# Patient Record
Sex: Female | Born: 1950 | Race: Black or African American | Hispanic: No | Smoking: Never smoker
Health system: Southern US, Community
[De-identification: ages and names within clinical notes are randomized; demographics above are authoritative.]

## PROBLEM LIST (undated history)

## (undated) DIAGNOSIS — C73 Malignant neoplasm of thyroid gland: Secondary | ICD-10-CM

## (undated) DIAGNOSIS — E785 Hyperlipidemia, unspecified: Secondary | ICD-10-CM

## (undated) DIAGNOSIS — I10 Essential (primary) hypertension: Secondary | ICD-10-CM

## (undated) DIAGNOSIS — D219 Benign neoplasm of connective and other soft tissue, unspecified: Secondary | ICD-10-CM

## (undated) DIAGNOSIS — H409 Unspecified glaucoma: Secondary | ICD-10-CM

## (undated) HISTORY — PX: CATARACT EXTRACTION: SUR2

## (undated) HISTORY — PX: THYROIDECTOMY: SHX17

## (undated) HISTORY — DX: Hyperlipidemia, unspecified: E78.5

## (undated) HISTORY — DX: Unspecified glaucoma: H40.9

## (undated) HISTORY — PX: PARATHYROIDECTOMY: SHX19

## (undated) HISTORY — PX: OTHER SURGICAL HISTORY: SHX169

## (undated) HISTORY — PX: ABDOMINAL HYSTERECTOMY: SHX81

---

## 1997-09-20 ENCOUNTER — Ambulatory Visit (HOSPITAL_COMMUNITY): Admission: RE | Admit: 1997-09-20 | Discharge: 1997-09-20 | Payer: Self-pay | Admitting: Cardiology

## 1998-06-14 ENCOUNTER — Ambulatory Visit (HOSPITAL_COMMUNITY): Admission: RE | Admit: 1998-06-14 | Discharge: 1998-06-14 | Payer: Self-pay | Admitting: Cardiology

## 1998-06-14 ENCOUNTER — Encounter: Payer: Self-pay | Admitting: Cardiology

## 1998-09-21 ENCOUNTER — Encounter (INDEPENDENT_AMBULATORY_CARE_PROVIDER_SITE_OTHER): Payer: Self-pay | Admitting: Specialist

## 1998-09-21 ENCOUNTER — Ambulatory Visit (HOSPITAL_COMMUNITY): Admission: RE | Admit: 1998-09-21 | Discharge: 1998-09-21 | Payer: Self-pay

## 1998-10-03 ENCOUNTER — Inpatient Hospital Stay (HOSPITAL_COMMUNITY): Admission: RE | Admit: 1998-10-03 | Discharge: 1998-10-06 | Payer: Self-pay

## 1998-12-03 ENCOUNTER — Ambulatory Visit (HOSPITAL_COMMUNITY): Admission: RE | Admit: 1998-12-03 | Discharge: 1998-12-03 | Payer: Self-pay

## 1998-12-13 ENCOUNTER — Ambulatory Visit (HOSPITAL_COMMUNITY): Admission: RE | Admit: 1998-12-13 | Discharge: 1998-12-13 | Payer: Self-pay

## 1999-03-15 ENCOUNTER — Encounter: Payer: Self-pay | Admitting: Cardiology

## 1999-03-15 ENCOUNTER — Ambulatory Visit (HOSPITAL_COMMUNITY): Admission: RE | Admit: 1999-03-15 | Discharge: 1999-03-15 | Payer: Self-pay | Admitting: Cardiology

## 1999-11-11 ENCOUNTER — Ambulatory Visit (HOSPITAL_COMMUNITY): Admission: RE | Admit: 1999-11-11 | Discharge: 1999-11-11 | Payer: Self-pay | Admitting: *Deleted

## 1999-11-15 ENCOUNTER — Ambulatory Visit (HOSPITAL_COMMUNITY): Admission: RE | Admit: 1999-11-15 | Discharge: 1999-11-15 | Payer: Self-pay | Admitting: *Deleted

## 1999-12-30 ENCOUNTER — Emergency Department (HOSPITAL_COMMUNITY): Admission: EM | Admit: 1999-12-30 | Discharge: 1999-12-31 | Payer: Self-pay | Admitting: *Deleted

## 1999-12-30 ENCOUNTER — Encounter: Payer: Self-pay | Admitting: *Deleted

## 2000-01-10 ENCOUNTER — Encounter: Admission: RE | Admit: 2000-01-10 | Discharge: 2000-01-10 | Payer: Self-pay | Admitting: Internal Medicine

## 2000-01-10 ENCOUNTER — Encounter: Payer: Self-pay | Admitting: Internal Medicine

## 2000-04-24 ENCOUNTER — Ambulatory Visit (HOSPITAL_COMMUNITY): Admission: RE | Admit: 2000-04-24 | Discharge: 2000-04-24 | Payer: Self-pay | Admitting: Cardiology

## 2000-04-24 ENCOUNTER — Encounter: Payer: Self-pay | Admitting: Cardiology

## 2001-05-10 ENCOUNTER — Ambulatory Visit (HOSPITAL_COMMUNITY): Admission: RE | Admit: 2001-05-10 | Discharge: 2001-05-10 | Payer: Self-pay | Admitting: Endocrinology

## 2001-05-10 ENCOUNTER — Encounter: Payer: Self-pay | Admitting: Endocrinology

## 2001-05-14 ENCOUNTER — Ambulatory Visit (HOSPITAL_COMMUNITY): Admission: RE | Admit: 2001-05-14 | Discharge: 2001-05-14 | Payer: Self-pay | Admitting: Endocrinology

## 2002-05-17 ENCOUNTER — Encounter (INDEPENDENT_AMBULATORY_CARE_PROVIDER_SITE_OTHER): Payer: Self-pay | Admitting: Specialist

## 2002-05-17 ENCOUNTER — Inpatient Hospital Stay (HOSPITAL_COMMUNITY): Admission: RE | Admit: 2002-05-17 | Discharge: 2002-05-19 | Payer: Self-pay | Admitting: Obstetrics and Gynecology

## 2002-10-05 ENCOUNTER — Emergency Department (HOSPITAL_COMMUNITY): Admission: EM | Admit: 2002-10-05 | Discharge: 2002-10-05 | Payer: Self-pay | Admitting: Emergency Medicine

## 2002-10-09 ENCOUNTER — Encounter: Payer: Self-pay | Admitting: Emergency Medicine

## 2002-10-09 ENCOUNTER — Emergency Department (HOSPITAL_COMMUNITY): Admission: EM | Admit: 2002-10-09 | Discharge: 2002-10-09 | Payer: Self-pay | Admitting: Emergency Medicine

## 2002-11-07 ENCOUNTER — Encounter: Payer: Self-pay | Admitting: Cardiology

## 2002-11-07 ENCOUNTER — Ambulatory Visit (HOSPITAL_COMMUNITY): Admission: RE | Admit: 2002-11-07 | Discharge: 2002-11-07 | Payer: Self-pay | Admitting: Cardiology

## 2003-08-18 ENCOUNTER — Encounter (HOSPITAL_COMMUNITY): Admission: RE | Admit: 2003-08-18 | Discharge: 2003-11-16 | Payer: Self-pay | Admitting: Cardiology

## 2004-01-26 ENCOUNTER — Ambulatory Visit (HOSPITAL_COMMUNITY): Admission: RE | Admit: 2004-01-26 | Discharge: 2004-01-26 | Payer: Self-pay | Admitting: Cardiology

## 2004-10-02 ENCOUNTER — Ambulatory Visit (HOSPITAL_COMMUNITY): Admission: RE | Admit: 2004-10-02 | Discharge: 2004-10-02 | Payer: Self-pay | Admitting: Gastroenterology

## 2004-11-04 ENCOUNTER — Other Ambulatory Visit: Admission: RE | Admit: 2004-11-04 | Discharge: 2004-11-04 | Payer: Self-pay | Admitting: Obstetrics and Gynecology

## 2005-12-08 ENCOUNTER — Encounter (HOSPITAL_COMMUNITY): Admission: RE | Admit: 2005-12-08 | Discharge: 2006-03-08 | Payer: Self-pay | Admitting: Cardiology

## 2007-09-25 ENCOUNTER — Emergency Department (HOSPITAL_COMMUNITY): Admission: EM | Admit: 2007-09-25 | Discharge: 2007-09-25 | Payer: Self-pay | Admitting: Family Medicine

## 2008-10-27 ENCOUNTER — Encounter: Admission: RE | Admit: 2008-10-27 | Discharge: 2008-10-27 | Payer: Self-pay | Admitting: Cardiology

## 2010-03-16 ENCOUNTER — Encounter: Payer: Self-pay | Admitting: Internal Medicine

## 2010-03-17 ENCOUNTER — Encounter: Payer: Self-pay | Admitting: Internal Medicine

## 2010-03-17 ENCOUNTER — Encounter: Payer: Self-pay | Admitting: Family Medicine

## 2010-04-19 ENCOUNTER — Other Ambulatory Visit (HOSPITAL_COMMUNITY): Payer: Self-pay | Admitting: Cardiology

## 2010-05-10 ENCOUNTER — Other Ambulatory Visit (HOSPITAL_COMMUNITY): Payer: Self-pay

## 2010-05-16 ENCOUNTER — Other Ambulatory Visit (HOSPITAL_COMMUNITY): Payer: Self-pay | Admitting: Cardiology

## 2010-06-12 ENCOUNTER — Other Ambulatory Visit (HOSPITAL_COMMUNITY): Payer: Self-pay

## 2010-07-12 NOTE — Op Note (Signed)
NAME:  Cynthia Pearson, Cynthia Pearson                       ACCOUNT NO.:  [id number]   MEDICAL RECORD NO.:  [id number]                   PATIENT TYPE:  INP   LOCATION:  9198                                 FACILITY:  [facility]   PHYSICIAN:  Carrington Clamp, M.D.              DATE OF BIRTH:  1950-11-09   DATE OF PROCEDURE:  05/17/2002  DATE OF DISCHARGE:                                 OPERATIVE REPORT   PREOPERATIVE DIAGNOSES:  A 22-week sized uterus with multiple fibroids.   POSTOPERATIVE DIAGNOSES:  1. A 22-week sized uterus with multiple fibroids.  2. Endometriosis.   PROCEDURE:  1. Total abdominal hysterectomy.  2. Right salpingo-oophorectomy.   ATTENDING:  Carrington Clamp, M.D.   ASSISTANT:  Luvenia Redden, M.D.   OBSERVER:  Duncan Dull, M.D.   ANESTHESIA:  General endotracheal anesthesia.   ESTIMATED BLOOD LOSS:  200 mL.   IV FLUIDS:  2300 mL.   URINE OUTPUT:  125 mL.   COMPLICATIONS:  None.   FINDINGS:  A 22-week sized uterus with multiple large fibroids.  There was  endometriosis noted on both ovaries, especially the right which had been  stuck via an endometrioma to the posterior cul-de-sac.  There were other  smaller areas of endometriosis noted as well on the uterus.  There were  otherwise normal bowels.   MEDICATIONS:  Floxacin.   PATHOLOGY:  Uterus, cervix, and right ovary and tube.   COUNTS:  Correct x3.   TECHNIQUE:  After adequate general anesthesia was achieved the patient was  prepped and draped in usual sterile fashion dorsal supine position.  A  vaginal examination was performed and it was noted that the uterus was  mobile enough that it would probably be able to be removed through a  Pfannenstiel skin incision.  A Pfannenstiel skin incision was then made with  the scalpel, carried down to the fascia with the Bovie cautery.  The fascia  was incised in midline and carried in a transverse curvilinear manner with  the Mayo scissors.  The fascia was reflected  from the rectus muscles both  superiorly and inferiorly and the rectus muscles split in the midline.  The  Balfour portion of peritoneum was tented up and entered into carefully with  Metzenbaum scissors.  The peritoneum was then incised in a superior to  inferior manner with good visualization of the bowel and bladder with the  Metzenbaum scissors.   The uterus was identified and exteriorized.  The Balfour retractor was  placed and the bowel packed away with five wet laps.  The round ligaments  were identified and divided with the Bovie cautery and suture transfixed  with 0 Vicryl.  The anterior leaf of the broad ligament was then incised  with the Bovie cautery bringing it down across the front of the uterus and  thus creating the bladder flap.  The posterior leaf of the broad ligament  was then entered  into bilaterally with the Bovie cautery and the uterine  ovarian ligaments clamped with a pair of Heaney clamps.  Each pedicle was  divided with the Mayo scissors, secured with tie in a pass, followed by a  stitch.  Some skeletonization was performed of the uterine arteries and a  pair of Heaneys were placed bilaterally across the uterine arteries at the  level of the internal os.  Each pedicle was divided with the Mayo scissors  and secured with a stitch of 0 Vicryl.   The bladder was retracted with blunt dissection of the bladder flap down off  of the cervix.  At this point there was enough room for [place] to amputate the  uterine specimen just above the uterine arteries with the scalpel.  This was  handed off to be given to pathology.   The cervical stump was then grasped with a pair of Kochers.  The cardinal  ligament was then divided with the alternating successive bites of the  Ballentine clamp.  Each pedicle was incised with the scalpel and secured  with a stitch of 0 Vicryl.  Posteriorly the posterior cul-de-sac was scarred  down from some endometriosis and it appeared that the  rectum was being  pulled towards the cervix.  An incision was made in the posterior peritoneum  of the cervix and sharp dissection was used to take this down.  Blunt  dissection was used to take it down even further thus retracting the bowel  out of the way.   At the level of the uterosacrals and the external os a pair of Heaney clamps  were placed.  Each pedicle was divided with the Mayo scissors and secured  with a Heaney stitch of 0 Vicryl.  The cervical stump was then amputated  handed to pathology.  The cuff was closed with a figure-of-eight stitch of 0  Vicryl.  Hemostasis was achieved with Bovie cautery.  The ureters were  identified by feel and found to be out of the field of dissection.  A small  amount of oozing from the bed of the endometrioma where the ovary had been  stuck was taken care of with Gelfoam.  At this time attention was turned to  the ovary which was oozing a little bit from the peritoneal surface,  especially where it had been stuck by the endometrioma.  Because it was so  involved with the endometriosis it was decided to remove this.  Dissection  was undertaken to isolate the infundibulopelvic ligament well above the  field of the ureter.  A Heaney was placed underneath the ovary on the  infundibulopelvic ligament.  The ovary was removed with the Mayo scissors.  The pedicle was then secured with a free hand tie followed by a stitch of 0  Vicryl.   Hemostasis was achieved and irrigation was performed.  All instruments and  laps were withdrawn from the abdomen.  The peritoneum was then closed with a  running stitch of 2-0 Vicryl.  The fascia was closed with a running stitch  of 0 PDS.  The subcutaneous tissue was rendered hemostatic with Bovie  cautery and irrigation.  The subcuticular layer was closed with interrupted  stitches of 3-0 plain gut.  The skin was then closed with a running subcuticular stitch of 4-0 Vicryl.  A pressure dressing was placed on the   incision and patient tolerated the procedure well.  Was returned to recovery  room in stable condition.  Carrington Clamp, M.D.    MH/MEDQ  D:  05/17/2002  T:  05/17/2002  Job:  [id number]

## 2010-07-12 NOTE — Discharge Summary (Signed)
   NAME:  Cynthia Pearson, Cynthia Pearson                       ACCOUNT NO.:  [id number]   MEDICAL RECORD NO.:  [id number]                   PATIENT TYPE:  INP   LOCATION:  9324                                 FACILITY:  [facility]   PHYSICIAN:  Carrington Clamp, M.D.              DATE OF BIRTH:  1950-03-31   DATE OF ADMISSION:  05/17/2002  DATE OF DISCHARGE:  05/19/2002                                 DISCHARGE SUMMARY   ADMISSION DIAGNOSIS:  22-week sized uterus with fibroids.   DISCHARGE DIAGNOSIS:  22-week sized uterus with fibroids.   PERTINENT PROCEDURES PERFORMED:  Total abdominal hysterectomy, right  salpingo-oophorectomy.   PERTINET LABORATORY DATA:  Preoperative H&H 11.6 and 35.4, postoperative H&H  9.4 and 28.3.   HISTORY OF PRESENT ILLNESS:  For history and physical please refer to the  dictated H&P on chart.  Briefly this is a 60 year old gravida 0 with last  menstrual period of 2/04 who presented with severe stomach pain and was  found to have a 22-weeks sized uterus consistent with fibroids.   HOSPITAL COURSE:  The patient was admitted on 05/17/02 for the above named  procedures without complications.  By postoperative day number two she was  eating, ambulating, voiding and was discharged home with the following.   DISCHARGE MEDICATIONS:  1. Percocet 5 mg one p.o. q.4-6h p.r.n. pain.  2. Hemosite Plus.   ACTIVITY:  Pelvic rest x6 weeks.  No heavy lifting x6 weeks.  No driving x2  weeks.   DIET:  High fiber and high water.   WOUND CARE:  Showers, no bath.   FOLLOW UP:  Two weeks.  The patient did not have staples and she had a  subcuticular closure.                                               Carrington Clamp, M.D.    MH/MEDQ  D:  06/09/2002  T:  06/09/2002  Job:  [id number]

## 2010-07-12 NOTE — H&P (Signed)
NAME:  Cynthia Pearson, Cynthia Pearson                       ACCOUNT NO.:  [id number]   MEDICAL RECORD NO.:  [id number]                   PATIENT TYPE:  INP   LOCATION:  NA                                   [facility]   PHYSICIAN:  Carrington Clamp, M.D.              DATE OF BIRTH:  04-07-1950   DATE OF ADMISSION:  05/16/2002  DATE OF DISCHARGE:                                HISTORY & PHYSICAL   CHIEF COMPLAINT:  This is a 60 year old, G0, with last menstrual period of  February 2004, who presented to her cardiologist with severe stomach pain.  She was found to have a 22-week size uterus consistent with fibroids.   HISTORY OF PRESENT ILLNESS:  The patient had been seen by her cardiologist  secondary to stomach problems which had worsened over the previous week said  to be a lot like indigestion or flu-like illness with nausea but no  vomiting.  No blood in her stool, no sick contacts.  Pain increased with  laxatives and was with spasms.  The cardiologist examined her and found her  to have an enlarged uterus and sent her to the gynecologist.   The patient had been doing better on a bland diet and had been given Nexium  to help with reflux.  The patient, on exam, was found to have approximately  two-week size uterus.  An ultrasound revealed 17 x 11 x 13 with normal  ovaries and endometrial stripe of 3.6.  There were multiple fibroids that  looked benign.   After discussion with the patient showing that sometimes the uterus can get  bigger, and also the enlarged uterus can be causing her bowel problems and  could sometimes cause hydroureter secondary to pressure on the ureters, the  patient agreed to a hysterectomy.   PAST MEDICAL HISTORY:  1. High blood pressure.  2. History of thyroid cancer with removal of the thyroid and now causing     hypothyroidism.   PAST GYNECOLOGIC HISTORY:  The patient had a history of fibroids in the past  which did not seem to be of concern.   PAST  OBSTETRICAL HISTORY:  None.   PAST SURGICAL HISTORY:  The patient had a thyroidectomy secondary to thyroid  cancer.   REVIEW OF SYSTEMS:  Negative for stress urinary incontinence except for some  urinary urgency.   MEDICATIONS:  1. Hydrochlorothiazide 25 mg 1 p.o. daily.  2. Levoxyl 150 mcg daily.  3. Klor-Con 1 tablet p.o. daily.   SOCIAL HISTORY:  The patient does not smoke tobacco.   LABORATORY DATA:  Ultrasound was as above.   The patient had a cardiology stress test recently that was negative for  cardiac problems.  The patient also denies any chest pain or shortness of  breath.   PHYSICAL EXAMINATION:  GENERAL:  The patient is well appearing.  VITAL SIGNS:  Weight 178 pounds.  HEENT:  Anicteric.  NECK:  No lymphadenopathy.  HEART:  Regular rate and rhythm.  LUNGS:  Clear to auscultation.  ABDOMEN:  Soft with palpable mass about 2 cm above the umbilicus. It is  slightly mobile.  PELVIC:  Normal external genitalia, vagina, and cervix.  The uterus filled  the pelvis and was only slightly mobile.  It was soft.  There was no blood  or masses extruding from the os.  Adnexa were not felt.  The bladder  appeared to be normal as well as the anus.   ASSESSMENT:  This is a 60 year old G0 with a 22-week size uterus filled with  fibroids.  The patient desires to retain her ovaries and has been consented  for total abdominal hysterectomy.  Discussed with the patient the  possibility of needing to make an up and down incision instead of  Pfannenstiel secondary to the size of the uterus, but that decision would be  made while she was asleep.  The patient will receive preoperative  antibiotics and sedatives.  She also forms keloids and is requesting minimal  scar if possible.                                               Carrington Clamp, M.D.    MH/MEDQ  D:  05/16/2002  T:  05/16/2002  Job:  [id number]

## 2011-01-28 ENCOUNTER — Other Ambulatory Visit: Payer: Self-pay | Admitting: Cardiology

## 2011-02-02 ENCOUNTER — Other Ambulatory Visit: Payer: Self-pay | Admitting: Cardiology

## 2011-02-10 ENCOUNTER — Emergency Department (HOSPITAL_COMMUNITY)
Admission: EM | Admit: 2011-02-10 | Discharge: 2011-02-11 | Disposition: A | Discharge status: Home / Self Care | Payer: BC Managed Care – PPO | Attending: Emergency Medicine | Admitting: Emergency Medicine

## 2011-02-10 ENCOUNTER — Encounter: Payer: Self-pay | Admitting: Emergency Medicine

## 2011-02-10 DIAGNOSIS — Z79899 Other long term (current) drug therapy: Secondary | ICD-10-CM | POA: Insufficient documentation

## 2011-02-10 DIAGNOSIS — I10 Essential (primary) hypertension: Secondary | ICD-10-CM | POA: Insufficient documentation

## 2011-02-10 DIAGNOSIS — S139XXA Sprain of joints and ligaments of unspecified parts of neck, initial encounter: Secondary | ICD-10-CM | POA: Insufficient documentation

## 2011-02-10 DIAGNOSIS — Z85850 Personal history of malignant neoplasm of thyroid: Secondary | ICD-10-CM | POA: Insufficient documentation

## 2011-02-10 DIAGNOSIS — R51 Headache: Secondary | ICD-10-CM | POA: Insufficient documentation

## 2011-02-10 DIAGNOSIS — X58XXXA Exposure to other specified factors, initial encounter: Secondary | ICD-10-CM | POA: Insufficient documentation

## 2011-02-10 DIAGNOSIS — C73 Malignant neoplasm of thyroid gland: Secondary | ICD-10-CM | POA: Insufficient documentation

## 2011-02-10 DIAGNOSIS — M542 Cervicalgia: Secondary | ICD-10-CM | POA: Insufficient documentation

## 2011-02-10 DIAGNOSIS — D219 Benign neoplasm of connective and other soft tissue, unspecified: Secondary | ICD-10-CM | POA: Insufficient documentation

## 2011-02-10 DIAGNOSIS — S161XXA Strain of muscle, fascia and tendon at neck level, initial encounter: Secondary | ICD-10-CM

## 2011-02-10 HISTORY — DX: Benign neoplasm of connective and other soft tissue, unspecified: D21.9

## 2011-02-10 HISTORY — DX: Malignant neoplasm of thyroid gland: C73

## 2011-02-10 HISTORY — DX: Essential (primary) hypertension: I10

## 2011-02-10 NOTE — ED Notes (Signed)
Pt c/o neck pain and possible swelling in neck; no SOB; pt sts possible reaction to shingles vaccine which received in November; pt sts frequent HA

## 2011-02-10 NOTE — ED Provider Notes (Signed)
History     CSN: [id number] Arrival date & time: 02/10/2011  6:22 PM   First MD Initiated Contact with Patient 02/10/11 2205      Chief Complaint  Patient presents with  . Neck Pain    (Consider location/radiation/quality/duration/timing/severity/associated sxs/prior treatment) The history is provided by the patient.   patient is a 60 year old female with a history of thyroid cancer(status post thyroidectomy, radiation several years ago) who presents with neck discomfort. She states this started one month ago. It has been intermittent but occurs daily. It is worse when she moves her head. She has not had any rash. She states that she has had mild associated frontal headache which comes and goes. She has not had any change in vision or focal weakness, sensory loss with this. She has also not had any trouble walking. She denies ear changes or tinnitus. She also denies dysphagia, dysarthria. No sore throat. In addition patient notes that she has had mild generalized body swelling.  This is very nonspecific, but she feels like her ankles and hands and abdomen are mildly swollen. This was worse over the last couple weeks but it has improved lately. She does take Lasix for edema and has not had any changes that lately. She denies any chest pain or dyspnea. She's able to maintain normal activity and is able walk up several stairs without any difficulty. Should patient denies any recent fever, cough, dysuria, or other infectious symptoms. Patient attributes all the symptoms to shingles vaccine she had obtained one month ago. She states the symptoms started one day after that. Overall severity describes mild.    Past Medical History  Diagnosis Date  . Hypertension   . Thyroid cancer   . Fibroids     Past Surgical History  Procedure Date  . Abdominal hysterectomy     No family history on file.  History  Substance Use Topics  . Smoking status: Never Smoker   . Smokeless tobacco: Not on file   . Alcohol Use: No    OB History    Grav Para Term Preterm Abortions TAB SAB Ect Mult Living                  Review of Systems  Constitutional: Negative for fever and chills.  HENT: Negative for facial swelling.   Eyes: Negative for visual disturbance.  Respiratory: Negative for cough, chest tightness, shortness of breath and wheezing.   Cardiovascular: Negative for chest pain.  Gastrointestinal: Negative for nausea, vomiting, abdominal pain and diarrhea.  Genitourinary: Negative for dysuria and difficulty urinating.  Skin: Negative for rash.  Neurological: Negative for weakness and numbness.  Psychiatric/Behavioral: Negative for behavioral problems and confusion.  All other systems reviewed and are negative.    Allergies  Diovan; Hctz; Hydrocodone; Lisinopril; Other; Prednisone; Promethazine; and Tetracyclines & related  Home Medications   Current Outpatient Rx  Name Route Sig Dispense Refill  . CALCIUM CARBONATE-VITAMIN D 500-200 MG-UNIT PO TABS Oral Take 1 tablet by mouth 2 (two) times daily.      . OMEGA-3 FATTY ACIDS 1000 MG PO CAPS Oral Take 1 g by mouth 2 (two) times daily.      Marland Kitchen FLAX SEED OIL 1000 MG PO CAPS Oral Take 1 capsule by mouth daily.      Marland Kitchen GARLIC 400 MG PO TBEC Oral Take 1 tablet by mouth daily.      Marland Kitchen HYDROCHLOROTHIAZIDE 25 MG PO TABS Oral Take 25 mg by mouth daily.      Marland Kitchen  LEVOTHYROXINE SODIUM 112 MCG PO TABS Oral Take 112 mcg by mouth daily.      Carma Leaven M PLUS PO TABS Oral Take 1 tablet by mouth daily.      Marland Kitchen POTASSIUM CHLORIDE CRYS CR 20 MEQ PO TBCR Oral Take 20 mEq by mouth daily.      . ACETAMINOPHEN 325 MG PO TABS Oral Take 2 tablets (650 mg total) by mouth every 6 (six) hours as needed for pain. 30 tablet 0    BP 124/85  Pulse 65  Temp(Src) 98.5 F (36.9 C) (Oral)  Resp 16  SpO2 98%  Physical Exam  Nursing note and vitals reviewed. Constitutional: She is oriented to person, place, and time. She appears well-developed and well-nourished.  No distress.  HENT:  Head: Normocephalic.  Mouth/Throat: Oropharynx is clear and moist.  Eyes: EOM are normal. Pupils are equal, round, and reactive to light. No scleral icterus.  Neck: Normal range of motion. Neck supple.       Old surgical scars from thyroid resection  Mild ttp over bilateral trapezius.  No midline ttp.  Cardiovascular: Normal rate, regular rhythm and intact distal pulses.   Pulmonary/Chest: Effort normal. No respiratory distress. She has no wheezes. She has no rales.  Abdominal: Soft. She exhibits no distension. There is no tenderness.  Musculoskeletal: Normal range of motion. She exhibits no edema and no tenderness.  Neurological: She is alert and oriented to person, place, and time. She has normal strength. No cranial nerve deficit or sensory deficit. She exhibits normal muscle tone. She displays a negative Romberg sign. Coordination and gait normal. GCS eye subscore is 4. GCS verbal subscore is 5. GCS motor subscore is 6.       No pronator drift  Ambulates normally including tip toe and heel walk  Skin: Skin is warm and dry. No rash noted. She is not diaphoretic.  Psychiatric: She has a normal mood and affect. Her behavior is normal. Thought content normal.    ED Course  Procedures (including critical care time)  Labs Reviewed  COMPREHENSIVE METABOLIC PANEL - Abnormal; Notable for the following:    Potassium 3.2 (*)    Glucose, Bld 101 (*)    GFR calc non Af Amer 71 (*)    GFR calc Af Amer 83 (*)    All other components within normal limits  URINALYSIS, ROUTINE W REFLEX MICROSCOPIC - Abnormal; Notable for the following:    Hgb urine dipstick LARGE (*)    Ketones, ur 15 (*)    Leukocytes, UA SMALL (*)    All other components within normal limits  URINE MICROSCOPIC-ADD ON - Abnormal; Notable for the following:    Bacteria, UA FEW (*)    All other components within normal limits  TSH  T4, FREE   No results found.   1. Neck strain       MDM    Patient here with a number of complaints that she feels stems from shingles vaccination. She's not had any fever or true infectious symptoms. Her full neuro exam is unremarkable. She also does not have significant edema on exam.  100% on RA and no rales on exam.  On her exam she has mild discomfort while palpating her muscles over her neck. Her lab work is unremarkable. She has mild hypokalemia but she did not take K-dur today. Renal function is good. She has known history of hematuria and is followed by a urologist for this.  Ordered thyroid studies considering patient's  variety of nonspecific symptoms.  Discussed these will take a couple days to come back in a PCP will followup the results. No s/s thyroid crisis.  The overall clinical picture is most suggestive of a neck strain. She is clinically well appearing. Her headache is vague and mild. No acute indications for imaging at this time. Return precautions discussed.        Milus Glazier 02/11/11 [id number]

## 2011-02-11 LAB — URINALYSIS, ROUTINE W REFLEX MICROSCOPIC
Glucose, UA: NEGATIVE mg/dL
Ketones, ur: 15 mg/dL — AB
Nitrite: NEGATIVE
Specific Gravity, Urine: 1.023 (ref 1.005–1.030)
pH: 5 (ref 5.0–8.0)

## 2011-02-11 LAB — URINE MICROSCOPIC-ADD ON

## 2011-02-11 LAB — COMPREHENSIVE METABOLIC PANEL
ALT: 14 U/L (ref 0–35)
AST: 19 U/L (ref 0–37)
Albumin: 4 g/dL (ref 3.5–5.2)
Alkaline Phosphatase: 54 U/L (ref 39–117)
CO2: 27 mEq/L (ref 19–32)
Calcium: 9.5 mg/dL (ref 8.4–10.5)
Chloride: 99 mEq/L (ref 96–112)
GFR calc non Af Amer: 71 mL/min — ABNORMAL LOW (ref >90)
Glucose, Bld: 101 mg/dL — ABNORMAL HIGH (ref 70–99)
Potassium: 3.2 mEq/L — ABNORMAL LOW (ref 3.5–5.1)
Sodium: 137 mEq/L (ref 135–145)
Total Bilirubin: 0.4 mg/dL (ref 0.3–1.2)
Total Protein: 7.7 g/dL (ref 6.0–8.3)

## 2011-02-11 LAB — T4, FREE: Free T4: 1.22 ng/dL (ref 0.80–1.80)

## 2011-02-11 MED ORDER — ACETAMINOPHEN 325 MG PO TABS
650.0000 mg | ORAL_TABLET | Freq: Once | ORAL | Status: AC
  Administered 2011-02-11: 650 mg via ORAL
  Filled 2011-02-11: qty 2

## 2011-02-11 MED ORDER — ACETAMINOPHEN 325 MG PO TABS: 650.0000 mg | ORAL_TABLET | Freq: Four times a day (QID) | ORAL | Status: AC | PRN

## 2011-02-11 NOTE — ED Provider Notes (Signed)
I saw and evaluated the patient, reviewed the resident's note and I agree with the findings and plan.  Donise Woodle, MD 02/11/11 0917 

## 2011-06-27 ENCOUNTER — Encounter (HOSPITAL_COMMUNITY)
Admission: RE | Admit: 2011-06-27 | Discharge: 2011-06-27 | Disposition: A | Discharge status: Home / Self Care | Payer: BC Managed Care – PPO | Source: Ambulatory Visit | Attending: Cardiology | Admitting: Cardiology

## 2011-06-27 ENCOUNTER — Other Ambulatory Visit: Payer: Self-pay

## 2011-06-27 DIAGNOSIS — I10 Essential (primary) hypertension: Secondary | ICD-10-CM | POA: Insufficient documentation

## 2011-06-27 DIAGNOSIS — R079 Chest pain, unspecified: Secondary | ICD-10-CM | POA: Insufficient documentation

## 2011-06-27 MED ORDER — TECHNETIUM TC 99M TETROFOSMIN IV KIT
30.0000 | PACK | Freq: Once | INTRAVENOUS | Status: AC | PRN
  Administered 2011-06-27: 30 via INTRAVENOUS

## 2011-06-27 MED ORDER — TECHNETIUM TC 99M TETROFOSMIN IV KIT
10.0000 | PACK | Freq: Once | INTRAVENOUS | Status: AC | PRN
  Administered 2011-06-27: 10 via INTRAVENOUS

## 2011-06-27 MED ORDER — REGADENOSON 0.4 MG/5ML IV SOLN
INTRAVENOUS | Status: AC
  Administered 2011-06-27: 0.4 mg
  Filled 2011-06-27: qty 5

## 2011-12-05 ENCOUNTER — Other Ambulatory Visit: Payer: Self-pay | Admitting: Internal Medicine

## 2011-12-05 ENCOUNTER — Ambulatory Visit
Admission: RE | Admit: 2011-12-05 | Discharge: 2011-12-05 | Disposition: A | Discharge status: Home / Self Care | Payer: BC Managed Care – PPO | Source: Ambulatory Visit | Attending: Internal Medicine | Admitting: Internal Medicine

## 2011-12-05 DIAGNOSIS — S0990XA Unspecified injury of head, initial encounter: Secondary | ICD-10-CM

## 2011-12-05 DIAGNOSIS — H538 Other visual disturbances: Secondary | ICD-10-CM

## 2012-06-18 ENCOUNTER — Other Ambulatory Visit: Payer: Self-pay | Admitting: Internal Medicine

## 2012-06-18 DIAGNOSIS — C73 Malignant neoplasm of thyroid gland: Secondary | ICD-10-CM

## 2012-06-23 ENCOUNTER — Ambulatory Visit
Admission: RE | Admit: 2012-06-23 | Discharge: 2012-06-23 | Disposition: A | Discharge status: Home / Self Care | Payer: BC Managed Care – PPO | Source: Ambulatory Visit | Attending: Internal Medicine | Admitting: Internal Medicine

## 2012-06-23 DIAGNOSIS — C73 Malignant neoplasm of thyroid gland: Secondary | ICD-10-CM

## 2012-11-22 ENCOUNTER — Ambulatory Visit: Payer: BC Managed Care – PPO | Admitting: Physical Therapy

## 2012-11-23 ENCOUNTER — Ambulatory Visit: Payer: BC Managed Care – PPO | Attending: Nurse Practitioner | Admitting: Physical Therapy

## 2012-11-23 DIAGNOSIS — IMO0001 Reserved for inherently not codable concepts without codable children: Secondary | ICD-10-CM | POA: Insufficient documentation

## 2012-11-23 DIAGNOSIS — R262 Difficulty in walking, not elsewhere classified: Secondary | ICD-10-CM | POA: Insufficient documentation

## 2012-11-23 DIAGNOSIS — M25579 Pain in unspecified ankle and joints of unspecified foot: Secondary | ICD-10-CM | POA: Insufficient documentation

## 2012-11-23 DIAGNOSIS — M7989 Other specified soft tissue disorders: Secondary | ICD-10-CM | POA: Insufficient documentation

## 2012-11-25 ENCOUNTER — Ambulatory Visit: Payer: BC Managed Care – PPO | Attending: Nurse Practitioner | Admitting: Rehabilitation

## 2012-11-25 DIAGNOSIS — IMO0001 Reserved for inherently not codable concepts without codable children: Secondary | ICD-10-CM | POA: Insufficient documentation

## 2012-11-25 DIAGNOSIS — R262 Difficulty in walking, not elsewhere classified: Secondary | ICD-10-CM | POA: Insufficient documentation

## 2012-11-25 DIAGNOSIS — M7989 Other specified soft tissue disorders: Secondary | ICD-10-CM | POA: Insufficient documentation

## 2012-11-25 DIAGNOSIS — M25579 Pain in unspecified ankle and joints of unspecified foot: Secondary | ICD-10-CM | POA: Insufficient documentation

## 2012-11-29 ENCOUNTER — Ambulatory Visit: Payer: BC Managed Care – PPO | Admitting: Physical Therapy

## 2012-12-02 ENCOUNTER — Ambulatory Visit: Payer: BC Managed Care – PPO | Admitting: Physical Therapy

## 2012-12-06 ENCOUNTER — Ambulatory Visit: Payer: BC Managed Care – PPO | Admitting: Physical Therapy

## 2012-12-09 ENCOUNTER — Ambulatory Visit: Payer: BC Managed Care – PPO | Admitting: Physical Therapy

## 2012-12-13 ENCOUNTER — Ambulatory Visit: Payer: BC Managed Care – PPO | Admitting: Physical Therapy

## 2012-12-16 ENCOUNTER — Ambulatory Visit: Payer: BC Managed Care – PPO | Admitting: Physical Therapy

## 2012-12-20 ENCOUNTER — Ambulatory Visit: Payer: BC Managed Care – PPO | Admitting: Physical Therapy

## 2012-12-23 ENCOUNTER — Ambulatory Visit: Payer: BC Managed Care – PPO | Admitting: Physical Therapy

## 2012-12-27 ENCOUNTER — Ambulatory Visit: Payer: BC Managed Care – PPO | Attending: Nurse Practitioner | Admitting: Physical Therapy

## 2012-12-27 DIAGNOSIS — M25579 Pain in unspecified ankle and joints of unspecified foot: Secondary | ICD-10-CM | POA: Insufficient documentation

## 2012-12-27 DIAGNOSIS — R262 Difficulty in walking, not elsewhere classified: Secondary | ICD-10-CM | POA: Insufficient documentation

## 2012-12-27 DIAGNOSIS — IMO0001 Reserved for inherently not codable concepts without codable children: Secondary | ICD-10-CM | POA: Insufficient documentation

## 2012-12-27 DIAGNOSIS — M7989 Other specified soft tissue disorders: Secondary | ICD-10-CM | POA: Insufficient documentation

## 2012-12-30 ENCOUNTER — Ambulatory Visit: Payer: BC Managed Care – PPO | Admitting: Physical Therapy

## 2013-01-03 ENCOUNTER — Ambulatory Visit: Payer: BC Managed Care – PPO | Admitting: Rehabilitation

## 2013-06-17 ENCOUNTER — Other Ambulatory Visit: Payer: Self-pay | Admitting: Internal Medicine

## 2013-06-17 DIAGNOSIS — C73 Malignant neoplasm of thyroid gland: Secondary | ICD-10-CM

## 2013-06-20 ENCOUNTER — Ambulatory Visit: Payer: BC Managed Care – PPO | Admitting: Endocrinology

## 2013-06-22 ENCOUNTER — Ambulatory Visit
Admission: RE | Admit: 2013-06-22 | Discharge: 2013-06-22 | Disposition: A | Discharge status: Home / Self Care | Payer: BC Managed Care – PPO | Source: Ambulatory Visit | Attending: Internal Medicine | Admitting: Internal Medicine

## 2013-06-22 DIAGNOSIS — C73 Malignant neoplasm of thyroid gland: Secondary | ICD-10-CM

## 2014-04-24 ENCOUNTER — Other Ambulatory Visit: Payer: Self-pay | Admitting: Internal Medicine

## 2014-04-24 DIAGNOSIS — R319 Hematuria, unspecified: Secondary | ICD-10-CM

## 2014-04-26 ENCOUNTER — Ambulatory Visit
Admission: RE | Admit: 2014-04-26 | Discharge: 2014-04-26 | Disposition: A | Discharge status: Home / Self Care | Payer: BC Managed Care – PPO | Source: Ambulatory Visit | Attending: Internal Medicine | Admitting: Internal Medicine

## 2014-04-26 DIAGNOSIS — R319 Hematuria, unspecified: Secondary | ICD-10-CM

## 2014-08-03 ENCOUNTER — Other Ambulatory Visit: Payer: Self-pay | Admitting: Internal Medicine

## 2014-08-03 ENCOUNTER — Ambulatory Visit
Admission: RE | Admit: 2014-08-03 | Discharge: 2014-08-03 | Disposition: A | Discharge status: Home / Self Care | Payer: BC Managed Care – PPO | Source: Ambulatory Visit | Attending: Internal Medicine | Admitting: Internal Medicine

## 2014-08-03 DIAGNOSIS — M89319 Hypertrophy of bone, unspecified shoulder: Secondary | ICD-10-CM

## 2014-09-07 ENCOUNTER — Other Ambulatory Visit: Payer: Self-pay | Admitting: Orthopedic Surgery

## 2014-09-07 DIAGNOSIS — R2231 Localized swelling, mass and lump, right upper limb: Secondary | ICD-10-CM

## 2014-09-21 ENCOUNTER — Ambulatory Visit
Admission: RE | Admit: 2014-09-21 | Discharge: 2014-09-21 | Disposition: A | Discharge status: Home / Self Care | Payer: BC Managed Care – PPO | Source: Ambulatory Visit | Attending: Orthopedic Surgery | Admitting: Orthopedic Surgery

## 2014-09-21 DIAGNOSIS — R2231 Localized swelling, mass and lump, right upper limb: Secondary | ICD-10-CM

## 2014-10-06 ENCOUNTER — Other Ambulatory Visit: Payer: Self-pay | Admitting: Internal Medicine

## 2014-10-06 DIAGNOSIS — N281 Cyst of kidney, acquired: Secondary | ICD-10-CM

## 2014-10-09 ENCOUNTER — Other Ambulatory Visit: Payer: BC Managed Care – PPO

## 2014-10-10 ENCOUNTER — Ambulatory Visit
Admission: RE | Admit: 2014-10-10 | Discharge: 2014-10-10 | Disposition: A | Discharge status: Home / Self Care | Payer: PRIVATE HEALTH INSURANCE | Source: Ambulatory Visit | Attending: Internal Medicine | Admitting: Internal Medicine

## 2014-10-10 DIAGNOSIS — N281 Cyst of kidney, acquired: Secondary | ICD-10-CM

## 2014-10-10 MED ORDER — IOPAMIDOL (ISOVUE-300) INJECTION 61%
100.0000 mL | Freq: Once | INTRAVENOUS | Status: AC | PRN
  Administered 2014-10-10: 100 mL via INTRAVENOUS

## 2014-10-13 ENCOUNTER — Other Ambulatory Visit: Payer: Self-pay | Admitting: Internal Medicine

## 2014-10-13 DIAGNOSIS — N838 Other noninflammatory disorders of ovary, fallopian tube and broad ligament: Secondary | ICD-10-CM

## 2014-10-17 ENCOUNTER — Ambulatory Visit
Admission: RE | Admit: 2014-10-17 | Discharge: 2014-10-17 | Disposition: A | Discharge status: Home / Self Care | Payer: Medicare Other | Source: Ambulatory Visit | Attending: Internal Medicine | Admitting: Internal Medicine

## 2014-10-17 DIAGNOSIS — N838 Other noninflammatory disorders of ovary, fallopian tube and broad ligament: Secondary | ICD-10-CM

## 2017-01-26 ENCOUNTER — Other Ambulatory Visit: Payer: Self-pay | Admitting: Geriatric Medicine

## 2017-01-26 DIAGNOSIS — Z1231 Encounter for screening mammogram for malignant neoplasm of breast: Secondary | ICD-10-CM

## 2017-02-26 ENCOUNTER — Ambulatory Visit: Payer: Medicare Other

## 2017-03-30 ENCOUNTER — Ambulatory Visit: Payer: Medicare Other

## 2017-04-17 ENCOUNTER — Ambulatory Visit: Payer: Medicare Other

## 2017-05-05 ENCOUNTER — Ambulatory Visit: Payer: Medicare Other

## 2017-06-04 ENCOUNTER — Ambulatory Visit: Payer: Medicare Other

## 2017-07-16 ENCOUNTER — Ambulatory Visit: Payer: Medicare Other

## 2019-04-22 ENCOUNTER — Ambulatory Visit: Payer: Medicare Other | Attending: Internal Medicine

## 2019-04-22 DIAGNOSIS — Z23 Encounter for immunization: Secondary | ICD-10-CM | POA: Insufficient documentation

## 2019-04-22 NOTE — Progress Notes (Signed)
   Covid-19 Vaccination Clinic  Name:  Cynthia Pearson    MRN: [id number] DOB: [birth year]  04/22/2019  [name] was observed post Covid-19 immunization for 15 minutes without incidence. She was provided with Vaccine Information Sheet and instruction to access the V-Safe system.   [name] was instructed to call 911 with any severe reactions post vaccine: Marland Kitchen Difficulty breathing  . Swelling of your face and throat  . A fast heartbeat  . A bad rash all over your body  . Dizziness and weakness    Immunizations Administered    Name Date Dose VIS Date Route   Pfizer COVID-19 Vaccine 04/22/2019  9:17 AM 0.3 mL 02/04/2019 Intramuscular   Manufacturer: [place]   Lot: [id number]   [place]: [id number]

## 2019-05-17 ENCOUNTER — Ambulatory Visit: Payer: Medicare Other | Attending: Internal Medicine

## 2019-05-17 DIAGNOSIS — Z23 Encounter for immunization: Secondary | ICD-10-CM

## 2019-05-17 NOTE — Progress Notes (Signed)
   Covid-19 Vaccination Clinic  Name:  Cynthia Pearson    MRN: [id number] DOB: April 22, 1950  05/17/2019  [name] was observed post Covid-19 immunization for 15 minutes without incident. She was provided with Vaccine Information Sheet and instruction to access the V-Safe system.   [name] was instructed to call 911 with any severe reactions post vaccine: Marland Kitchen Difficulty breathing  . Swelling of face and throat  . A fast heartbeat  . A bad rash all over body  . Dizziness and weakness   Immunizations Administered    Name Date Dose VIS Date Route   Pfizer COVID-19 Vaccine 05/17/2019 11:17 AM 0.3 mL 02/04/2019 Intramuscular   Manufacturer: [place]   Lot: [id number]   [place]: [id number]

## 2019-05-30 DIAGNOSIS — E039 Hypothyroidism, unspecified: Secondary | ICD-10-CM | POA: Diagnosis not present

## 2019-05-30 DIAGNOSIS — E785 Hyperlipidemia, unspecified: Secondary | ICD-10-CM | POA: Diagnosis not present

## 2019-05-30 DIAGNOSIS — I10 Essential (primary) hypertension: Secondary | ICD-10-CM | POA: Diagnosis not present

## 2019-07-12 DIAGNOSIS — L700 Acne vulgaris: Secondary | ICD-10-CM | POA: Diagnosis not present

## 2019-07-12 DIAGNOSIS — I872 Venous insufficiency (chronic) (peripheral): Secondary | ICD-10-CM | POA: Diagnosis not present

## 2019-07-12 DIAGNOSIS — L821 Other seborrheic keratosis: Secondary | ICD-10-CM | POA: Diagnosis not present

## 2019-07-12 DIAGNOSIS — L905 Scar conditions and fibrosis of skin: Secondary | ICD-10-CM | POA: Diagnosis not present

## 2019-07-12 DIAGNOSIS — I8311 Varicose veins of right lower extremity with inflammation: Secondary | ICD-10-CM | POA: Diagnosis not present

## 2019-07-12 DIAGNOSIS — I8312 Varicose veins of left lower extremity with inflammation: Secondary | ICD-10-CM | POA: Diagnosis not present

## 2019-07-13 DIAGNOSIS — I10 Essential (primary) hypertension: Secondary | ICD-10-CM | POA: Diagnosis not present

## 2019-07-13 DIAGNOSIS — Z1389 Encounter for screening for other disorder: Secondary | ICD-10-CM | POA: Diagnosis not present

## 2019-07-13 DIAGNOSIS — Z0000 Encounter for general adult medical examination without abnormal findings: Secondary | ICD-10-CM | POA: Diagnosis not present

## 2019-07-13 DIAGNOSIS — E7800 Pure hypercholesterolemia, unspecified: Secondary | ICD-10-CM | POA: Diagnosis not present

## 2019-08-18 DIAGNOSIS — S20161A Insect bite (nonvenomous) of breast, right breast, initial encounter: Secondary | ICD-10-CM | POA: Diagnosis not present

## 2019-09-06 DIAGNOSIS — H402233 Chronic angle-closure glaucoma, bilateral, severe stage: Secondary | ICD-10-CM | POA: Diagnosis not present

## 2019-09-06 DIAGNOSIS — H2513 Age-related nuclear cataract, bilateral: Secondary | ICD-10-CM | POA: Diagnosis not present

## 2019-09-19 DIAGNOSIS — H409 Unspecified glaucoma: Secondary | ICD-10-CM | POA: Diagnosis not present

## 2019-09-19 DIAGNOSIS — E785 Hyperlipidemia, unspecified: Secondary | ICD-10-CM | POA: Diagnosis not present

## 2019-09-19 DIAGNOSIS — E039 Hypothyroidism, unspecified: Secondary | ICD-10-CM | POA: Diagnosis not present

## 2019-09-19 DIAGNOSIS — I10 Essential (primary) hypertension: Secondary | ICD-10-CM | POA: Diagnosis not present

## 2019-09-23 DIAGNOSIS — Z79899 Other long term (current) drug therapy: Secondary | ICD-10-CM | POA: Diagnosis not present

## 2019-09-23 DIAGNOSIS — H402233 Chronic angle-closure glaucoma, bilateral, severe stage: Secondary | ICD-10-CM | POA: Diagnosis not present

## 2019-09-23 DIAGNOSIS — H2513 Age-related nuclear cataract, bilateral: Secondary | ICD-10-CM | POA: Diagnosis not present

## 2019-10-06 DIAGNOSIS — I10 Essential (primary) hypertension: Secondary | ICD-10-CM | POA: Diagnosis not present

## 2019-10-06 DIAGNOSIS — S50862A Insect bite (nonvenomous) of left forearm, initial encounter: Secondary | ICD-10-CM | POA: Diagnosis not present

## 2019-10-06 DIAGNOSIS — L989 Disorder of the skin and subcutaneous tissue, unspecified: Secondary | ICD-10-CM | POA: Diagnosis not present

## 2019-11-15 DIAGNOSIS — I10 Essential (primary) hypertension: Secondary | ICD-10-CM | POA: Diagnosis not present

## 2019-11-24 DIAGNOSIS — H402233 Chronic angle-closure glaucoma, bilateral, severe stage: Secondary | ICD-10-CM | POA: Diagnosis not present

## 2019-11-24 DIAGNOSIS — Z79899 Other long term (current) drug therapy: Secondary | ICD-10-CM | POA: Diagnosis not present

## 2019-11-24 DIAGNOSIS — H2513 Age-related nuclear cataract, bilateral: Secondary | ICD-10-CM | POA: Diagnosis not present

## 2019-12-06 ENCOUNTER — Other Ambulatory Visit: Payer: Self-pay | Admitting: Geriatric Medicine

## 2019-12-06 ENCOUNTER — Ambulatory Visit
Admission: RE | Admit: 2019-12-06 | Discharge: 2019-12-06 | Disposition: A | Discharge status: Home / Self Care | Payer: Medicare PPO | Source: Ambulatory Visit | Attending: Geriatric Medicine | Admitting: Geriatric Medicine

## 2019-12-06 DIAGNOSIS — M25561 Pain in right knee: Secondary | ICD-10-CM

## 2019-12-06 DIAGNOSIS — M25562 Pain in left knee: Secondary | ICD-10-CM

## 2019-12-06 DIAGNOSIS — M25511 Pain in right shoulder: Secondary | ICD-10-CM | POA: Diagnosis not present

## 2019-12-06 DIAGNOSIS — I10 Essential (primary) hypertension: Secondary | ICD-10-CM | POA: Diagnosis not present

## 2019-12-06 DIAGNOSIS — M19011 Primary osteoarthritis, right shoulder: Secondary | ICD-10-CM | POA: Diagnosis not present

## 2019-12-14 DIAGNOSIS — T783XXA Angioneurotic edema, initial encounter: Secondary | ICD-10-CM | POA: Diagnosis not present

## 2019-12-14 DIAGNOSIS — I10 Essential (primary) hypertension: Secondary | ICD-10-CM | POA: Diagnosis not present

## 2019-12-26 DIAGNOSIS — E039 Hypothyroidism, unspecified: Secondary | ICD-10-CM | POA: Diagnosis not present

## 2019-12-26 DIAGNOSIS — E785 Hyperlipidemia, unspecified: Secondary | ICD-10-CM | POA: Diagnosis not present

## 2019-12-26 DIAGNOSIS — I10 Essential (primary) hypertension: Secondary | ICD-10-CM | POA: Diagnosis not present

## 2019-12-29 DIAGNOSIS — Z85850 Personal history of malignant neoplasm of thyroid: Secondary | ICD-10-CM | POA: Diagnosis not present

## 2019-12-29 DIAGNOSIS — E890 Postprocedural hypothyroidism: Secondary | ICD-10-CM | POA: Diagnosis not present

## 2020-01-27 DIAGNOSIS — I10 Essential (primary) hypertension: Secondary | ICD-10-CM | POA: Diagnosis not present

## 2020-02-28 DIAGNOSIS — E890 Postprocedural hypothyroidism: Secondary | ICD-10-CM | POA: Diagnosis not present

## 2020-02-28 DIAGNOSIS — Z85850 Personal history of malignant neoplasm of thyroid: Secondary | ICD-10-CM | POA: Diagnosis not present

## 2020-04-03 DIAGNOSIS — H402233 Chronic angle-closure glaucoma, bilateral, severe stage: Secondary | ICD-10-CM | POA: Diagnosis not present

## 2020-04-03 DIAGNOSIS — H2513 Age-related nuclear cataract, bilateral: Secondary | ICD-10-CM | POA: Diagnosis not present

## 2020-04-04 DIAGNOSIS — I10 Essential (primary) hypertension: Secondary | ICD-10-CM | POA: Diagnosis not present

## 2020-04-04 DIAGNOSIS — H409 Unspecified glaucoma: Secondary | ICD-10-CM | POA: Diagnosis not present

## 2020-04-04 DIAGNOSIS — E039 Hypothyroidism, unspecified: Secondary | ICD-10-CM | POA: Diagnosis not present

## 2020-04-04 DIAGNOSIS — E785 Hyperlipidemia, unspecified: Secondary | ICD-10-CM | POA: Diagnosis not present

## 2020-05-02 DIAGNOSIS — I10 Essential (primary) hypertension: Secondary | ICD-10-CM | POA: Diagnosis not present

## 2020-05-02 DIAGNOSIS — H409 Unspecified glaucoma: Secondary | ICD-10-CM | POA: Diagnosis not present

## 2020-05-02 DIAGNOSIS — E785 Hyperlipidemia, unspecified: Secondary | ICD-10-CM | POA: Diagnosis not present

## 2020-05-02 DIAGNOSIS — E039 Hypothyroidism, unspecified: Secondary | ICD-10-CM | POA: Diagnosis not present

## 2020-07-03 DIAGNOSIS — H2513 Age-related nuclear cataract, bilateral: Secondary | ICD-10-CM | POA: Diagnosis not present

## 2020-07-03 DIAGNOSIS — H402233 Chronic angle-closure glaucoma, bilateral, severe stage: Secondary | ICD-10-CM | POA: Diagnosis not present

## 2020-07-03 DIAGNOSIS — Z79899 Other long term (current) drug therapy: Secondary | ICD-10-CM | POA: Diagnosis not present

## 2020-07-17 DIAGNOSIS — I10 Essential (primary) hypertension: Secondary | ICD-10-CM | POA: Diagnosis not present

## 2020-07-17 DIAGNOSIS — Z0000 Encounter for general adult medical examination without abnormal findings: Secondary | ICD-10-CM | POA: Diagnosis not present

## 2020-07-17 DIAGNOSIS — Z79899 Other long term (current) drug therapy: Secondary | ICD-10-CM | POA: Diagnosis not present

## 2020-07-17 DIAGNOSIS — I700 Atherosclerosis of aorta: Secondary | ICD-10-CM | POA: Diagnosis not present

## 2020-07-17 DIAGNOSIS — E890 Postprocedural hypothyroidism: Secondary | ICD-10-CM | POA: Diagnosis not present

## 2020-07-17 DIAGNOSIS — E7800 Pure hypercholesterolemia, unspecified: Secondary | ICD-10-CM | POA: Diagnosis not present

## 2020-07-17 DIAGNOSIS — Z1389 Encounter for screening for other disorder: Secondary | ICD-10-CM | POA: Diagnosis not present

## 2020-07-24 DIAGNOSIS — L218 Other seborrheic dermatitis: Secondary | ICD-10-CM | POA: Diagnosis not present

## 2020-07-24 DIAGNOSIS — L720 Epidermal cyst: Secondary | ICD-10-CM | POA: Diagnosis not present

## 2020-07-24 DIAGNOSIS — D2271 Melanocytic nevi of right lower limb, including hip: Secondary | ICD-10-CM | POA: Diagnosis not present

## 2020-07-24 DIAGNOSIS — D2372 Other benign neoplasm of skin of left lower limb, including hip: Secondary | ICD-10-CM | POA: Diagnosis not present

## 2020-07-24 DIAGNOSIS — D2272 Melanocytic nevi of left lower limb, including hip: Secondary | ICD-10-CM | POA: Diagnosis not present

## 2020-07-24 DIAGNOSIS — L819 Disorder of pigmentation, unspecified: Secondary | ICD-10-CM | POA: Diagnosis not present

## 2020-07-24 DIAGNOSIS — L821 Other seborrheic keratosis: Secondary | ICD-10-CM | POA: Diagnosis not present

## 2020-07-24 DIAGNOSIS — L304 Erythema intertrigo: Secondary | ICD-10-CM | POA: Diagnosis not present

## 2020-07-24 DIAGNOSIS — D225 Melanocytic nevi of trunk: Secondary | ICD-10-CM | POA: Diagnosis not present

## 2020-08-17 DIAGNOSIS — E039 Hypothyroidism, unspecified: Secondary | ICD-10-CM | POA: Diagnosis not present

## 2020-08-17 DIAGNOSIS — I10 Essential (primary) hypertension: Secondary | ICD-10-CM | POA: Diagnosis not present

## 2020-08-17 DIAGNOSIS — E785 Hyperlipidemia, unspecified: Secondary | ICD-10-CM | POA: Diagnosis not present

## 2020-08-17 DIAGNOSIS — H409 Unspecified glaucoma: Secondary | ICD-10-CM | POA: Diagnosis not present

## 2020-10-02 DIAGNOSIS — L821 Other seborrheic keratosis: Secondary | ICD-10-CM | POA: Diagnosis not present

## 2020-10-02 DIAGNOSIS — L304 Erythema intertrigo: Secondary | ICD-10-CM | POA: Diagnosis not present

## 2020-10-18 DIAGNOSIS — L237 Allergic contact dermatitis due to plants, except food: Secondary | ICD-10-CM | POA: Diagnosis not present

## 2020-10-18 DIAGNOSIS — I10 Essential (primary) hypertension: Secondary | ICD-10-CM | POA: Diagnosis not present

## 2020-10-24 DIAGNOSIS — L239 Allergic contact dermatitis, unspecified cause: Secondary | ICD-10-CM | POA: Diagnosis not present

## 2020-10-24 DIAGNOSIS — L237 Allergic contact dermatitis due to plants, except food: Secondary | ICD-10-CM | POA: Diagnosis not present

## 2020-11-14 DIAGNOSIS — I10 Essential (primary) hypertension: Secondary | ICD-10-CM | POA: Diagnosis not present

## 2020-11-19 DIAGNOSIS — I10 Essential (primary) hypertension: Secondary | ICD-10-CM | POA: Diagnosis not present

## 2020-11-19 DIAGNOSIS — E785 Hyperlipidemia, unspecified: Secondary | ICD-10-CM | POA: Diagnosis not present

## 2020-11-19 DIAGNOSIS — E039 Hypothyroidism, unspecified: Secondary | ICD-10-CM | POA: Diagnosis not present

## 2020-12-04 DIAGNOSIS — L237 Allergic contact dermatitis due to plants, except food: Secondary | ICD-10-CM | POA: Diagnosis not present

## 2020-12-04 DIAGNOSIS — L304 Erythema intertrigo: Secondary | ICD-10-CM | POA: Diagnosis not present

## 2020-12-21 ENCOUNTER — Other Ambulatory Visit: Payer: Self-pay

## 2020-12-21 ENCOUNTER — Encounter: Payer: Self-pay | Admitting: Podiatry

## 2020-12-21 ENCOUNTER — Ambulatory Visit: Payer: Medicare PPO | Admitting: Podiatry

## 2020-12-21 VITALS — BP 169/91 | HR 65 | Temp 97.3°F | Resp 16

## 2020-12-21 DIAGNOSIS — M79675 Pain in left toe(s): Secondary | ICD-10-CM | POA: Diagnosis not present

## 2020-12-21 DIAGNOSIS — B351 Tinea unguium: Secondary | ICD-10-CM | POA: Diagnosis not present

## 2020-12-21 DIAGNOSIS — M79674 Pain in right toe(s): Secondary | ICD-10-CM | POA: Diagnosis not present

## 2020-12-21 NOTE — Progress Notes (Signed)
   SUBJECTIVE Patient presents to office today complaining of elongated, thickened nails that cause pain while ambulating in shoes.  Patient is unable to trim their own nails. Patient is here for further evaluation and treatment.  Past Medical History:  Diagnosis Date   Fibroids    Hypertension    Thyroid cancer ([place])     OBJECTIVE General Patient is awake, alert, and oriented x 3 and in no acute distress. Derm Skin is dry and supple bilateral. Negative open lesions or macerations. Remaining integument unremarkable. Nails are tender, long, thickened and dystrophic with subungual debris, consistent with onychomycosis, 1-5 bilateral. No signs of infection noted. Vasc  DP and PT pedal pulses palpable bilaterally. Temperature gradient within normal limits.  Neuro Epicritic and protective threshold sensation grossly intact bilaterally.  Musculoskeletal Exam No symptomatic pedal deformities noted bilateral. Muscular strength within normal limits.  ASSESSMENT 1.  Pain due to onychomycosis of toenails both  PLAN OF CARE 1. Patient evaluated today.  2. Instructed to maintain good pedal hygiene and foot care.  3. Mechanical debridement of nails 1-5 bilaterally performed using a nail nipper. Filed with dremel without incident.  4. Return to clinic in 3 mos.    Edrick Kins, DPM [facility]  Dr. Edrick Kins, DPM    [address], [place] [address]                Office [phone]  Fax [phone]

## 2020-12-22 IMAGING — CR DG KNEE COMPLETE 4+V*R*
4 series · 4 of 4 positions shown · non-contrast
Comparison: None.

CLINICAL DATA: Bilateral knee pain after fall last week.

EXAM:
RIGHT KNEE - COMPLETE 4+ VIEW

[t knee ap right]
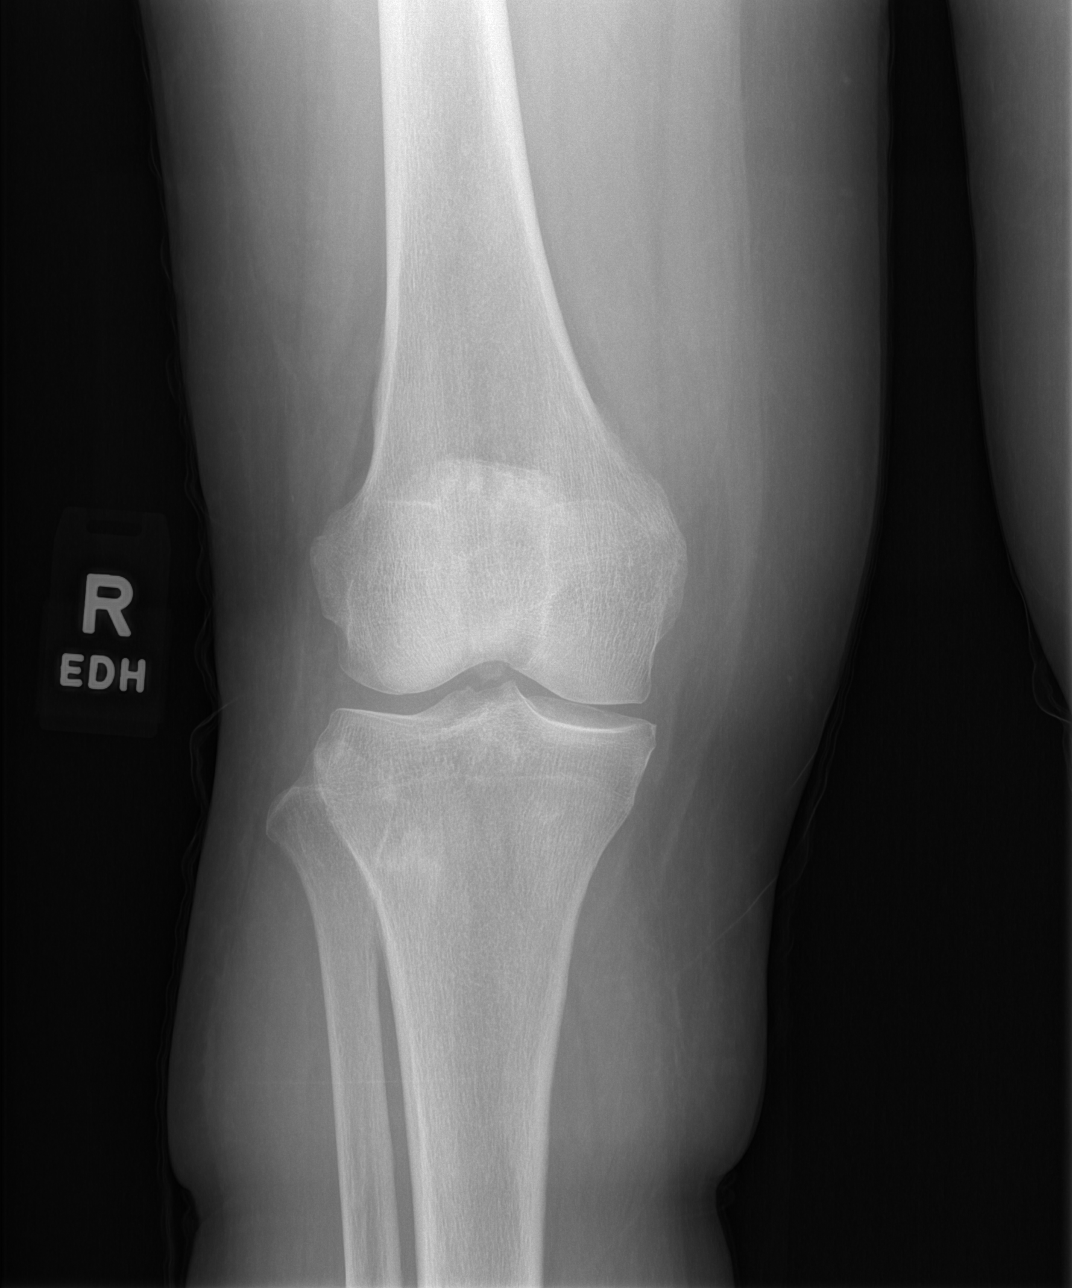

[t knee oblique right (1 of 2)]
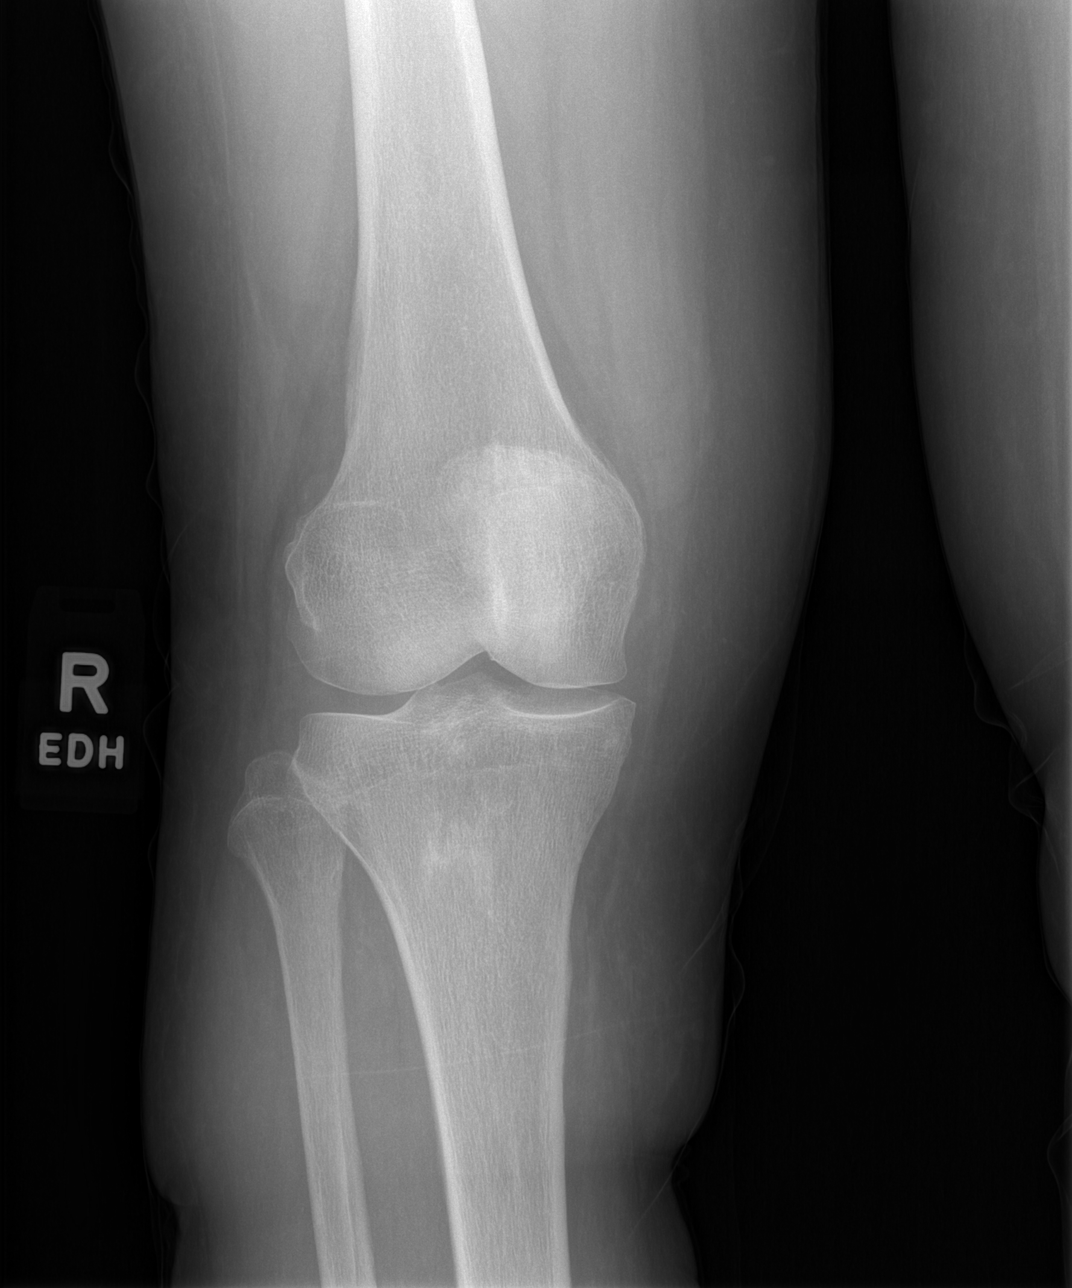

[t knee oblique right (2 of 2)]
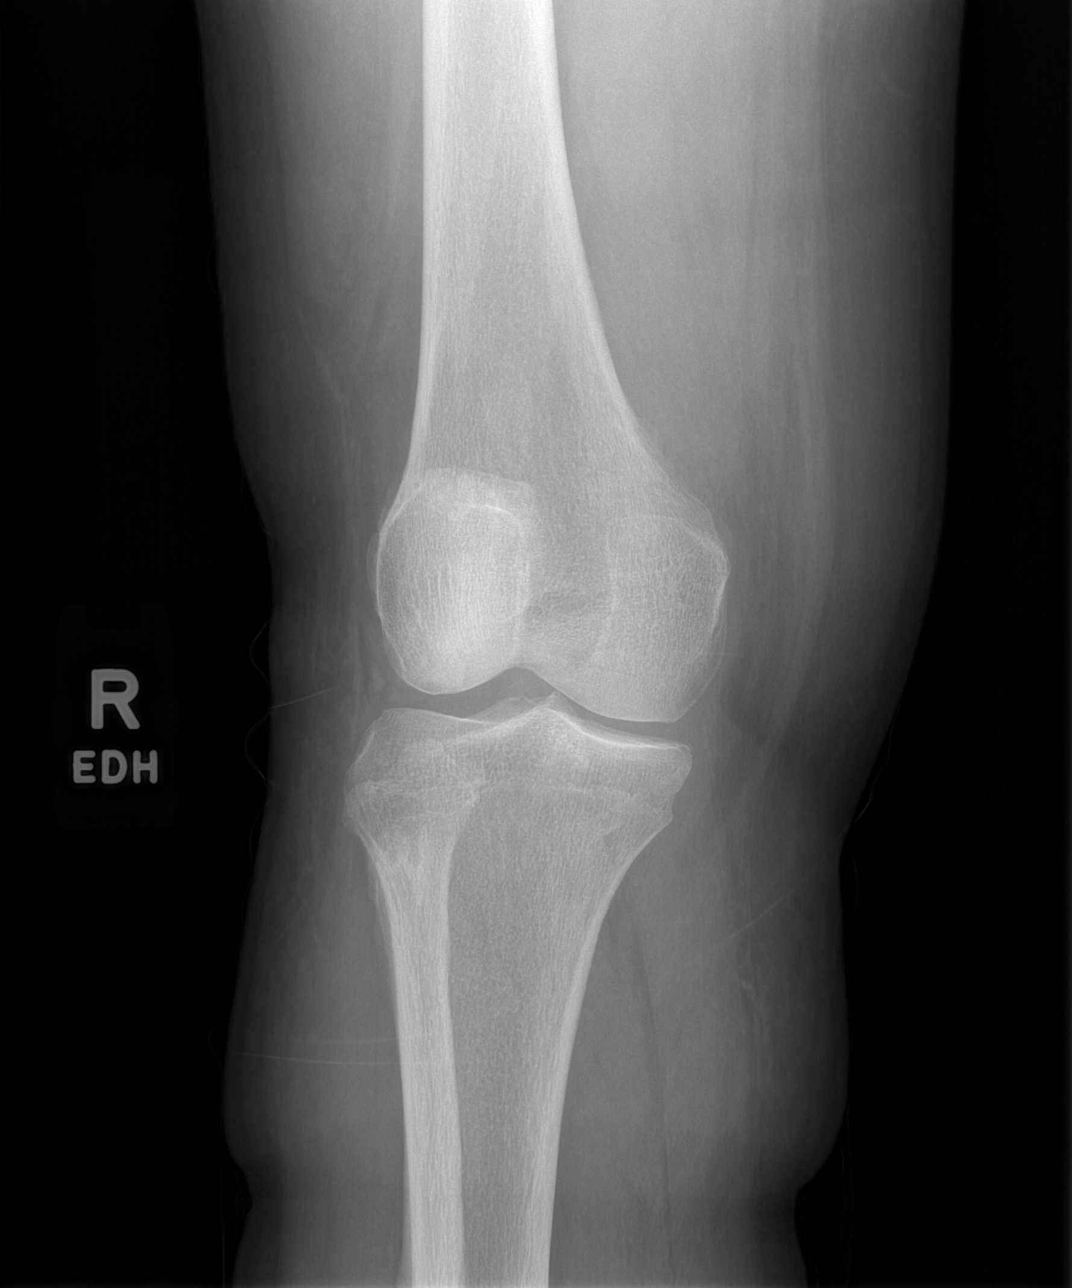

[t knee lat right]
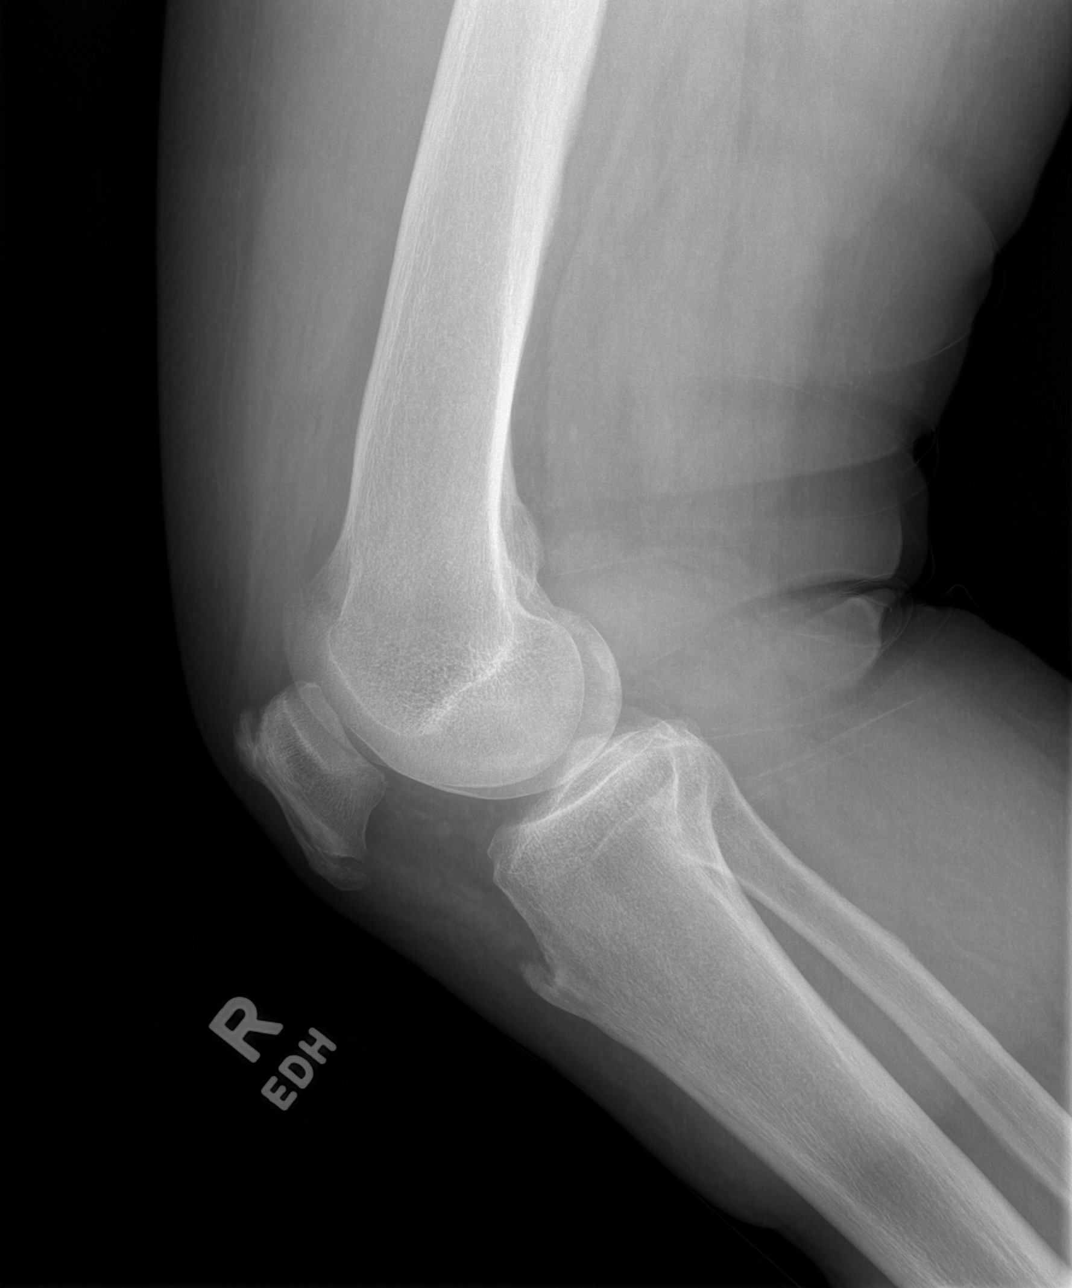

[4 of 4 positions shown; findings below may reference images not displayed]

FINDINGS: No evidence of fracture, dislocation, or joint effusion. No evidence
of arthropathy or other focal bone abnormality. Soft tissues are
unremarkable.
IMPRESSION: Negative.

## 2020-12-28 DIAGNOSIS — E890 Postprocedural hypothyroidism: Secondary | ICD-10-CM | POA: Diagnosis not present

## 2020-12-28 DIAGNOSIS — Z85850 Personal history of malignant neoplasm of thyroid: Secondary | ICD-10-CM | POA: Diagnosis not present

## 2020-12-28 DIAGNOSIS — Z23 Encounter for immunization: Secondary | ICD-10-CM | POA: Diagnosis not present

## 2021-02-06 DIAGNOSIS — E785 Hyperlipidemia, unspecified: Secondary | ICD-10-CM | POA: Diagnosis not present

## 2021-02-06 DIAGNOSIS — I10 Essential (primary) hypertension: Secondary | ICD-10-CM | POA: Diagnosis not present

## 2021-02-26 DIAGNOSIS — E890 Postprocedural hypothyroidism: Secondary | ICD-10-CM | POA: Diagnosis not present

## 2021-05-24 DIAGNOSIS — E039 Hypothyroidism, unspecified: Secondary | ICD-10-CM | POA: Diagnosis not present

## 2021-05-24 DIAGNOSIS — R0789 Other chest pain: Secondary | ICD-10-CM | POA: Diagnosis not present

## 2021-05-24 DIAGNOSIS — I10 Essential (primary) hypertension: Secondary | ICD-10-CM | POA: Diagnosis not present

## 2021-05-24 DIAGNOSIS — E785 Hyperlipidemia, unspecified: Secondary | ICD-10-CM | POA: Diagnosis not present

## 2021-08-23 DIAGNOSIS — L821 Other seborrheic keratosis: Secondary | ICD-10-CM | POA: Diagnosis not present

## 2021-08-23 DIAGNOSIS — L304 Erythema intertrigo: Secondary | ICD-10-CM | POA: Diagnosis not present

## 2021-08-23 DIAGNOSIS — D2372 Other benign neoplasm of skin of left lower limb, including hip: Secondary | ICD-10-CM | POA: Diagnosis not present

## 2021-08-23 DIAGNOSIS — L700 Acne vulgaris: Secondary | ICD-10-CM | POA: Diagnosis not present

## 2022-06-26 DIAGNOSIS — H401133 Primary open-angle glaucoma, bilateral, severe stage: Secondary | ICD-10-CM | POA: Diagnosis not present

## 2022-06-26 DIAGNOSIS — H43813 Vitreous degeneration, bilateral: Secondary | ICD-10-CM | POA: Diagnosis not present

## 2022-06-26 DIAGNOSIS — H348311 Tributary (branch) retinal vein occlusion, right eye, with retinal neovascularization: Secondary | ICD-10-CM | POA: Diagnosis not present

## 2022-07-01 DIAGNOSIS — H2513 Age-related nuclear cataract, bilateral: Secondary | ICD-10-CM | POA: Diagnosis not present

## 2022-07-01 DIAGNOSIS — H401132 Primary open-angle glaucoma, bilateral, moderate stage: Secondary | ICD-10-CM | POA: Diagnosis not present

## 2022-07-01 DIAGNOSIS — H31322 Choroidal rupture, left eye: Secondary | ICD-10-CM | POA: Diagnosis not present

## 2022-07-01 DIAGNOSIS — H31012 Macula scars of posterior pole (postinflammatory) (post-traumatic), left eye: Secondary | ICD-10-CM | POA: Diagnosis not present

## 2022-07-01 DIAGNOSIS — H43813 Vitreous degeneration, bilateral: Secondary | ICD-10-CM | POA: Diagnosis not present

## 2022-07-01 DIAGNOSIS — H35033 Hypertensive retinopathy, bilateral: Secondary | ICD-10-CM | POA: Diagnosis not present

## 2022-07-01 DIAGNOSIS — H34831 Tributary (branch) retinal vein occlusion, right eye, with macular edema: Secondary | ICD-10-CM | POA: Diagnosis not present

## 2022-07-01 DIAGNOSIS — H34811 Central retinal vein occlusion, right eye, with macular edema: Secondary | ICD-10-CM | POA: Diagnosis not present

## 2022-07-01 DIAGNOSIS — H401133 Primary open-angle glaucoma, bilateral, severe stage: Secondary | ICD-10-CM | POA: Diagnosis not present

## 2022-07-30 DIAGNOSIS — Z1211 Encounter for screening for malignant neoplasm of colon: Secondary | ICD-10-CM | POA: Diagnosis not present

## 2022-07-30 DIAGNOSIS — Z0000 Encounter for general adult medical examination without abnormal findings: Secondary | ICD-10-CM | POA: Diagnosis not present

## 2022-07-30 DIAGNOSIS — Z79899 Other long term (current) drug therapy: Secondary | ICD-10-CM | POA: Diagnosis not present

## 2022-07-30 DIAGNOSIS — E7800 Pure hypercholesterolemia, unspecified: Secondary | ICD-10-CM | POA: Diagnosis not present

## 2022-07-30 DIAGNOSIS — I10 Essential (primary) hypertension: Secondary | ICD-10-CM | POA: Diagnosis not present

## 2022-07-30 DIAGNOSIS — E892 Postprocedural hypoparathyroidism: Secondary | ICD-10-CM | POA: Diagnosis not present

## 2022-07-30 DIAGNOSIS — Z1231 Encounter for screening mammogram for malignant neoplasm of breast: Secondary | ICD-10-CM | POA: Diagnosis not present

## 2022-07-30 DIAGNOSIS — I700 Atherosclerosis of aorta: Secondary | ICD-10-CM | POA: Diagnosis not present

## 2022-08-12 DIAGNOSIS — H43813 Vitreous degeneration, bilateral: Secondary | ICD-10-CM | POA: Diagnosis not present

## 2022-08-12 DIAGNOSIS — H34811 Central retinal vein occlusion, right eye, with macular edema: Secondary | ICD-10-CM | POA: Diagnosis not present

## 2022-08-12 DIAGNOSIS — H35033 Hypertensive retinopathy, bilateral: Secondary | ICD-10-CM | POA: Diagnosis not present

## 2022-08-12 DIAGNOSIS — H401132 Primary open-angle glaucoma, bilateral, moderate stage: Secondary | ICD-10-CM | POA: Diagnosis not present

## 2022-08-12 DIAGNOSIS — H31012 Macula scars of posterior pole (postinflammatory) (post-traumatic), left eye: Secondary | ICD-10-CM | POA: Diagnosis not present

## 2022-11-05 DIAGNOSIS — I10 Essential (primary) hypertension: Secondary | ICD-10-CM | POA: Diagnosis not present

## 2022-11-05 DIAGNOSIS — Z79899 Other long term (current) drug therapy: Secondary | ICD-10-CM | POA: Diagnosis not present

## 2022-11-10 ENCOUNTER — Ambulatory Visit
Admission: RE | Admit: 2022-11-10 | Discharge: 2022-11-10 | Disposition: A | Discharge status: Home / Self Care | Payer: Medicare PPO | Source: Ambulatory Visit | Attending: Internal Medicine | Admitting: Internal Medicine

## 2022-11-10 ENCOUNTER — Other Ambulatory Visit: Payer: Self-pay | Admitting: Internal Medicine

## 2022-11-10 DIAGNOSIS — M25561 Pain in right knee: Secondary | ICD-10-CM | POA: Diagnosis not present

## 2022-11-10 DIAGNOSIS — S199XXA Unspecified injury of neck, initial encounter: Secondary | ICD-10-CM | POA: Diagnosis not present

## 2022-11-10 DIAGNOSIS — T1490XA Injury, unspecified, initial encounter: Secondary | ICD-10-CM

## 2022-11-10 DIAGNOSIS — S8991XA Unspecified injury of right lower leg, initial encounter: Secondary | ICD-10-CM | POA: Diagnosis not present

## 2022-11-10 DIAGNOSIS — W19XXXA Unspecified fall, initial encounter: Secondary | ICD-10-CM | POA: Diagnosis not present

## 2022-11-10 DIAGNOSIS — M542 Cervicalgia: Secondary | ICD-10-CM | POA: Diagnosis not present

## 2022-11-10 DIAGNOSIS — M1711 Unilateral primary osteoarthritis, right knee: Secondary | ICD-10-CM | POA: Diagnosis not present

## 2023-03-04 DIAGNOSIS — H40033 Anatomical narrow angle, bilateral: Secondary | ICD-10-CM | POA: Diagnosis not present

## 2023-03-04 DIAGNOSIS — H2513 Age-related nuclear cataract, bilateral: Secondary | ICD-10-CM | POA: Diagnosis not present

## 2023-03-04 DIAGNOSIS — H401133 Primary open-angle glaucoma, bilateral, severe stage: Secondary | ICD-10-CM | POA: Diagnosis not present

## 2023-03-24 DIAGNOSIS — L308 Other specified dermatitis: Secondary | ICD-10-CM | POA: Diagnosis not present

## 2023-03-24 DIAGNOSIS — L309 Dermatitis, unspecified: Secondary | ICD-10-CM | POA: Diagnosis not present

## 2023-03-24 DIAGNOSIS — L270 Generalized skin eruption due to drugs and medicaments taken internally: Secondary | ICD-10-CM | POA: Diagnosis not present

## 2023-03-31 DIAGNOSIS — H401133 Primary open-angle glaucoma, bilateral, severe stage: Secondary | ICD-10-CM | POA: Diagnosis not present

## 2023-03-31 DIAGNOSIS — H2511 Age-related nuclear cataract, right eye: Secondary | ICD-10-CM | POA: Diagnosis not present

## 2023-03-31 DIAGNOSIS — H40033 Anatomical narrow angle, bilateral: Secondary | ICD-10-CM | POA: Diagnosis not present

## 2023-04-07 DIAGNOSIS — L309 Dermatitis, unspecified: Secondary | ICD-10-CM | POA: Diagnosis not present

## 2023-04-10 DIAGNOSIS — E890 Postprocedural hypothyroidism: Secondary | ICD-10-CM | POA: Diagnosis not present

## 2023-04-10 DIAGNOSIS — I10 Essential (primary) hypertension: Secondary | ICD-10-CM | POA: Diagnosis not present

## 2023-04-10 DIAGNOSIS — R21 Rash and other nonspecific skin eruption: Secondary | ICD-10-CM | POA: Diagnosis not present

## 2023-04-10 DIAGNOSIS — Z85850 Personal history of malignant neoplasm of thyroid: Secondary | ICD-10-CM | POA: Diagnosis not present

## 2023-04-10 DIAGNOSIS — R682 Dry mouth, unspecified: Secondary | ICD-10-CM | POA: Diagnosis not present

## 2023-04-23 DIAGNOSIS — H2511 Age-related nuclear cataract, right eye: Secondary | ICD-10-CM | POA: Diagnosis not present

## 2023-04-23 DIAGNOSIS — H401113 Primary open-angle glaucoma, right eye, severe stage: Secondary | ICD-10-CM | POA: Diagnosis not present

## 2023-05-24 DIAGNOSIS — H2512 Age-related nuclear cataract, left eye: Secondary | ICD-10-CM | POA: Diagnosis not present

## 2023-05-28 DIAGNOSIS — H401123 Primary open-angle glaucoma, left eye, severe stage: Secondary | ICD-10-CM | POA: Diagnosis not present

## 2023-05-28 DIAGNOSIS — H2512 Age-related nuclear cataract, left eye: Secondary | ICD-10-CM | POA: Diagnosis not present

## 2023-06-09 DIAGNOSIS — L821 Other seborrheic keratosis: Secondary | ICD-10-CM | POA: Diagnosis not present

## 2023-06-09 DIAGNOSIS — L810 Postinflammatory hyperpigmentation: Secondary | ICD-10-CM | POA: Diagnosis not present

## 2023-07-13 DIAGNOSIS — L309 Dermatitis, unspecified: Secondary | ICD-10-CM | POA: Diagnosis not present

## 2023-07-13 DIAGNOSIS — L821 Other seborrheic keratosis: Secondary | ICD-10-CM | POA: Diagnosis not present

## 2023-07-13 DIAGNOSIS — L308 Other specified dermatitis: Secondary | ICD-10-CM | POA: Diagnosis not present

## 2023-07-13 DIAGNOSIS — L810 Postinflammatory hyperpigmentation: Secondary | ICD-10-CM | POA: Diagnosis not present

## 2023-08-17 DIAGNOSIS — H4311 Vitreous hemorrhage, right eye: Secondary | ICD-10-CM | POA: Diagnosis not present

## 2023-08-18 DIAGNOSIS — H43813 Vitreous degeneration, bilateral: Secondary | ICD-10-CM | POA: Diagnosis not present

## 2023-08-18 DIAGNOSIS — H401132 Primary open-angle glaucoma, bilateral, moderate stage: Secondary | ICD-10-CM | POA: Diagnosis not present

## 2023-08-18 DIAGNOSIS — H31012 Macula scars of posterior pole (postinflammatory) (post-traumatic), left eye: Secondary | ICD-10-CM | POA: Diagnosis not present

## 2023-08-18 DIAGNOSIS — H35033 Hypertensive retinopathy, bilateral: Secondary | ICD-10-CM | POA: Diagnosis not present

## 2023-08-18 DIAGNOSIS — H34811 Central retinal vein occlusion, right eye, with macular edema: Secondary | ICD-10-CM | POA: Diagnosis not present

## 2023-08-18 DIAGNOSIS — H4311 Vitreous hemorrhage, right eye: Secondary | ICD-10-CM | POA: Diagnosis not present

## 2023-08-18 DIAGNOSIS — Z961 Presence of intraocular lens: Secondary | ICD-10-CM | POA: Diagnosis not present

## 2023-09-02 DIAGNOSIS — H43813 Vitreous degeneration, bilateral: Secondary | ICD-10-CM | POA: Diagnosis not present

## 2023-09-02 DIAGNOSIS — H31012 Macula scars of posterior pole (postinflammatory) (post-traumatic), left eye: Secondary | ICD-10-CM | POA: Diagnosis not present

## 2023-09-02 DIAGNOSIS — H4311 Vitreous hemorrhage, right eye: Secondary | ICD-10-CM | POA: Diagnosis not present

## 2023-09-02 DIAGNOSIS — H34811 Central retinal vein occlusion, right eye, with macular edema: Secondary | ICD-10-CM | POA: Diagnosis not present

## 2023-09-02 DIAGNOSIS — H35033 Hypertensive retinopathy, bilateral: Secondary | ICD-10-CM | POA: Diagnosis not present

## 2023-09-02 DIAGNOSIS — H401132 Primary open-angle glaucoma, bilateral, moderate stage: Secondary | ICD-10-CM | POA: Diagnosis not present

## 2023-09-02 DIAGNOSIS — Z961 Presence of intraocular lens: Secondary | ICD-10-CM | POA: Diagnosis not present

## 2023-09-15 DIAGNOSIS — H547 Unspecified visual loss: Secondary | ICD-10-CM | POA: Diagnosis not present

## 2023-09-15 DIAGNOSIS — I10 Essential (primary) hypertension: Secondary | ICD-10-CM | POA: Diagnosis not present

## 2023-09-29 DIAGNOSIS — L738 Other specified follicular disorders: Secondary | ICD-10-CM | POA: Diagnosis not present

## 2023-09-29 DIAGNOSIS — L304 Erythema intertrigo: Secondary | ICD-10-CM | POA: Diagnosis not present

## 2023-09-29 DIAGNOSIS — L821 Other seborrheic keratosis: Secondary | ICD-10-CM | POA: Diagnosis not present

## 2023-09-29 DIAGNOSIS — L820 Inflamed seborrheic keratosis: Secondary | ICD-10-CM | POA: Diagnosis not present

## 2023-10-01 ENCOUNTER — Encounter: Payer: Self-pay | Admitting: Allergy and Immunology

## 2023-10-01 ENCOUNTER — Ambulatory Visit: Admitting: Allergy and Immunology

## 2023-10-01 VITALS — BP 212/114 | HR 84 | Resp 16 | Ht 63.0 in | Wt 150.2 lb

## 2023-10-01 DIAGNOSIS — Z888 Allergy status to other drugs, medicaments and biological substances status: Secondary | ICD-10-CM

## 2023-10-01 DIAGNOSIS — L821 Other seborrheic keratosis: Secondary | ICD-10-CM | POA: Diagnosis not present

## 2023-10-01 DIAGNOSIS — I10 Essential (primary) hypertension: Secondary | ICD-10-CM | POA: Diagnosis not present

## 2023-10-01 DIAGNOSIS — T50905D Adverse effect of unspecified drugs, medicaments and biological substances, subsequent encounter: Secondary | ICD-10-CM

## 2023-10-01 DIAGNOSIS — Z129 Encounter for screening for malignant neoplasm, site unspecified: Secondary | ICD-10-CM

## 2023-10-01 NOTE — Progress Notes (Signed)
 [place] - [place] - [place] - [place] - [place]   Dear Dr. Dwight,  Thank you for referring Cynthia Pearson to the [facility] [place]  on 10/01/2023.   Below is a summation of this patient's evaluation and recommendations.  Thank you for your referral. I will keep you informed about this patient's response to treatment.   If you have any questions please do not hesitate to contact me.   Sincerely,  Camellia DOROTHA Denis, MD Allergy / Immunology [place] [facility] [place]    ______________________________________________________________________    NEW PATIENT NOTE  Referring Provider: Dwight Trula SQUIBB, MD Primary Provider: Dwight Trula SQUIBB, MD Date of office visit: 10/01/2023    Subjective:   Chief Complaint:  Cynthia Pearson (DOB: Jul 16, 1950) is a 73 y.o. female who presents to the clinic on 10/01/2023 with a chief complaint of Medication Reaction .     HPI: Cynthia Pearson presents to this clinic in evaluation of possible drug hypersensitivity reaction.  It is somewhat of a confusing story but it sounds as though she developed an acute dermatitis in January 2025 that may have been associated with the use of telmisartan.  However, she was using telmisartan on an intermittent basis for at least a year prior to the onset of her rash.  She did increase the dose from 40 mg to 80 mg sometime in the winter.  She developed this very scaly dark rash that started around her neck and then progressed down her back.  She subsequently saw dermatology, Dr. Amy Swaziland, who performed a biopsy and said that there may have been a drug reaction.  During this time that she had the dermatitis she also had some associated systemic and constitutional symptoms.  She felt short of breath and weak she might have some cough and she had dry mouth and overall just did not feel well.  Fortunately, all of those associated systemic and constitutional  symptoms have stopped and her dermatitis has not progressed over the course of the past few months.  It has not resolved as well.  She has very severe hypertension and she comes with a list of medications showing hypersensitivity directed against multiple antihypertensive medicines.  She developed racing heart with Diovan HCT, some type of unknown reaction with lisinopril HCT, headache and chest heaviness with hydrochlorothiazide, ankle swelling and chest pain with losartan, sluggishness with metoprolol, chest pain with atenolol, rash with nifedipine.  She has never had a colonoscopy.  Her last mammogram was greater than 10 years ago.  She cannot remember her last pelvic exam.  Past Medical History:  Diagnosis Date   Fibroids    Glaucoma    Hypertension    Thyroid  cancer (HCC)     Past Surgical History:  Procedure Laterality Date   ABDOMINAL HYSTERECTOMY     CATARACT EXTRACTION     OTHER SURGICAL HISTORY     Fibroid surgery with hysterectomy   PARATHYROIDECTOMY     THYROIDECTOMY      Allergies as of 10/01/2023       Reactions   Telmisartan Shortness Of Breath, Rash   Loss of appetite, dry mouth   Atenolol Other (See Comments)   Chest pain   Diovan [valsartan]    Hctz [hydrochlorothiazide] Other (See Comments)   Headache, chest heaviness   Hydrocodone    Lisinopril    Losartan Other (See Comments)   Ankle swelling and chest pain   Metoprolol Other (See  Comments)   Overall feels bad, sluggish, chest/arm pain   Other    Shingles vaccine   Tirosint ( brand levothyroxine)   Prednisone    Promethazine    Tetracyclines & Related    Hydrocodone-acetaminophen  Palpitations   Nifedipine Rash        Medication List    amLODipine 5 MG tablet Commonly known as: NORVASC Take 5 mg by mouth daily.   Combigan 0.2-0.5 % ophthalmic solution Generic drug: brimonidine-timolol Apply 1 drop to eye 2 (two) times daily.   dorzolamide 2 % ophthalmic solution Commonly known as:  TRUSOPT 1 drop 2 (two) times daily.   levothyroxine 112 MCG tablet Commonly known as: SYNTHROID Take 112 mcg by mouth daily.   Rocklatan 0.02-0.005 % Soln Generic drug: Netarsudil-Latanoprost    Review of systems negative except as noted in HPI / PMHx or noted below:  Review of Systems  Constitutional: Negative.   HENT: Negative.    Eyes: Negative.   Respiratory: Negative.    Cardiovascular: Negative.   Gastrointestinal: Negative.   Genitourinary: Negative.   Musculoskeletal: Negative.   Skin: Negative.   Neurological: Negative.   Endo/Heme/Allergies: Negative.   Psychiatric/Behavioral: Negative.      Family History  Problem Relation Age of Onset   Cancer Mother    Cancer Father    Heart Problems Father    Heart Problems Maternal Aunt    Glaucoma Cousin     Social History   Socioeconomic History   Marital status: Widowed    Spouse name: Not on file   Number of children: Not on file   Years of education: Not on file   Highest education level: Not on file  Occupational History   Not on file  Tobacco Use   Smoking status: Never   Smokeless tobacco: Never  Substance and Sexual Activity   Alcohol use: No   Drug use: No   Sexual activity: Not on file  Other Topics Concern   Not on file  Social History Narrative   Not on file   Environmental and Social history  Lives in a house with a dry environment, no animals located to the house, no carpet in the bedroom, no plastic on the bed, no plastic on the pillow, no smoking ongoing with inside the household.  Objective:   Vitals:   10/01/23 0947  BP: (!) 198/96  Pulse: 84  Resp: 16  SpO2: 99%   Height: 5' 3 (160 cm) Weight: 150 lb 3.2 oz (68.1 kg)  Physical Exam Constitutional:      Appearance: She is not diaphoretic.  HENT:     Head: Normocephalic.     Right Ear: Tympanic membrane, ear canal and external ear normal.     Left Ear: Tympanic membrane, ear canal and external ear normal.     Nose:  Nose normal. No mucosal edema or rhinorrhea.     Mouth/Throat:     Pharynx: Uvula midline. No oropharyngeal exudate.  Eyes:     Conjunctiva/sclera: Conjunctivae normal.  Neck:     Thyroid : No thyromegaly.     Trachea: Trachea normal. No tracheal tenderness or tracheal deviation.  Cardiovascular:     Rate and Rhythm: Normal rate and regular rhythm.     Heart sounds: Normal heart sounds, S1 normal and S2 normal. No murmur heard. Pulmonary:     Effort: No respiratory distress.     Breath sounds: Normal breath sounds. No stridor. No wheezing or rales.  Lymphadenopathy:  Head:     Right side of head: No tonsillar adenopathy.     Left side of head: No tonsillar adenopathy.     Cervical: No cervical adenopathy.  Skin:    Findings: Rash (Numerous hyperpigmented seborrheic keratosis across entire back in a Christmas tree pattern) present. No erythema.     Nails: There is no clubbing.  Neurological:     Mental Status: She is alert.     Diagnostics: Allergy skin tests were performed.  Assessment and Plan:    1. Cynthia Pearson sign present   2. Primary hypertension   3. Adverse effect of drug, subsequent encounter    1. Obtain a colonoscopy - Dr. Dwight  2. Obtain a mammogram - Dr. Dwight  3. Obtain a pelvic exam - Dr. Dwight  4. Obtain a chest X-ray  5. Start low dose Hydralazine - Dr. Dwight  6. Review biopsies from Dr. Swaziland  7. Further evaluation and treatment???  8. Influenza = Tamiflu. Covid = Paxlovid  Adrien has the sign of Cynthia Pearson and although this may have been secondary to a medication side effect I think we are obligated to rule out a neoplastic disease contributing to the development of her seborrheic keratosis eruption that occurred in January 2025.  I am hopeful that her seborrheic keratosis eruption was a manifestation of a immunologically mediated reaction directed against her antihypertensive medicine but once again with her deficit of neoplastic screening I  think we are obligated to make sure she does not have a worrisome systemic disease contributing to her sign of Cynthia Pearson.  She has never had a colonoscopy and she has not had a mammogram in over a decade or pelvic exam and those would be good initial screening test but she may also require a abdominal and and pelvic CT scan to look for a visceral malignancy.  We will obtain a chest x-ray and I am going to leave the logistics of arranging for other neoplastic screening to her primary care doctor.  She has apparently had side effects with multiple antihypertensive medications of multiple classes.  I think I would start with a very low dose of hydralazine and slowly build up over a few months.  We are not going to direct the treatment of her hypertension other than to make some suggestions such as hydralazine use and we will leave it up to her primary care doctor to write the prescriptions for hydralazine.  I will review the biopsies from Dr. Swaziland.  I think we will just need to see how things move forward as we start to collect some information regarding her neoplastic screening.  Camellia DOROTHA Denis, MD Allergy / Immunology [facility] 

## 2023-10-01 NOTE — Patient Instructions (Addendum)
  1. Obtain a colonoscopy - Dr. Dwight  2. Obtain a mammogram - Dr. Dwight  3. Obtain a pelvic exam - Dr. Dwight  4. Obtain a chest X-ray  5. Start low dose Hydralazine - Dr. Dwight  6. Review biopsies from Dr. Swaziland  7. Further evaluation and treatment???  8. Influenza = Tamiflu. Covid = Paxlovid

## 2023-11-24 DIAGNOSIS — H4311 Vitreous hemorrhage, right eye: Secondary | ICD-10-CM | POA: Diagnosis not present

## 2023-11-24 DIAGNOSIS — Z961 Presence of intraocular lens: Secondary | ICD-10-CM | POA: Diagnosis not present

## 2023-11-24 DIAGNOSIS — H401133 Primary open-angle glaucoma, bilateral, severe stage: Secondary | ICD-10-CM | POA: Diagnosis not present

## 2023-11-26 DIAGNOSIS — H34811 Central retinal vein occlusion, right eye, with macular edema: Secondary | ICD-10-CM | POA: Diagnosis not present

## 2023-11-26 DIAGNOSIS — H4311 Vitreous hemorrhage, right eye: Secondary | ICD-10-CM | POA: Diagnosis not present

## 2023-11-26 DIAGNOSIS — H401132 Primary open-angle glaucoma, bilateral, moderate stage: Secondary | ICD-10-CM | POA: Diagnosis not present

## 2023-11-26 DIAGNOSIS — H35033 Hypertensive retinopathy, bilateral: Secondary | ICD-10-CM | POA: Diagnosis not present

## 2023-11-26 DIAGNOSIS — H43813 Vitreous degeneration, bilateral: Secondary | ICD-10-CM | POA: Diagnosis not present

## 2023-11-26 DIAGNOSIS — H31012 Macula scars of posterior pole (postinflammatory) (post-traumatic), left eye: Secondary | ICD-10-CM | POA: Diagnosis not present

## 2023-11-26 DIAGNOSIS — Z961 Presence of intraocular lens: Secondary | ICD-10-CM | POA: Diagnosis not present

## 2023-12-22 ENCOUNTER — Encounter: Payer: Self-pay | Admitting: Podiatry

## 2023-12-22 ENCOUNTER — Ambulatory Visit: Admitting: Podiatry

## 2023-12-22 VITALS — Ht 63.0 in | Wt 150.2 lb

## 2023-12-22 DIAGNOSIS — M79675 Pain in left toe(s): Secondary | ICD-10-CM

## 2023-12-22 DIAGNOSIS — M79674 Pain in right toe(s): Secondary | ICD-10-CM

## 2023-12-22 DIAGNOSIS — B351 Tinea unguium: Secondary | ICD-10-CM

## 2023-12-22 DIAGNOSIS — L03032 Cellulitis of left toe: Secondary | ICD-10-CM | POA: Diagnosis not present

## 2023-12-22 MED ORDER — CEPHALEXIN 500 MG PO CAPS: 500.0000 mg | ORAL_CAPSULE | Freq: Three times a day (TID) | ORAL | 0 refills | Status: AC

## 2023-12-22 NOTE — Progress Notes (Signed)
   SUBJECTIVE Patient presents to office today complaining of elongated, thickened nails that cause pain while ambulating in shoes.  Patient is unable to trim their own nails. Patient is here for further evaluation and treatment.  Past Medical History:  Diagnosis Date   Fibroids    Glaucoma    Hypertension    Thyroid  cancer (HCC)     OBJECTIVE General Patient is awake, alert, and oriented x 3 and in no acute distress. Derm Skin is dry and supple bilateral. Negative open lesions or macerations. Remaining integument unremarkable. Nails are very tender, extremely long, thickened and dystrophic with subungual debris, consistent with onychomycosis, 1-5 bilateral. No signs of infection noted.  The left hallux nail plate is loosely adhered with significant amount of underlying debris and some localized surrounding cellulitis Vasc  DP and PT pedal pulses palpable bilaterally. Temperature gradient within normal limits.  Neuro light touch and protective threshold sensation grossly intact bilaterally.  Musculoskeletal Exam No symptomatic pedal deformities noted bilateral. Muscular strength within normal limits.  Patient ambulatory  ASSESSMENT 1.  Pain due to onychomycosis of toenails both 2.  Paronychia left hallux nail plate  PLAN OF CARE -Patient evaluated today.  -Instructed to maintain good pedal hygiene and foot care.  -Mechanical debridement of nails 1-5 bilaterally performed using a nail nipper. Filed with dremel without incident.  -The left hallux nail plate was extremely tender and there was a significant amount of underlying debris.  Decision made to temporarily remove the nail plate and allow new nail to grow.  This was discussed with the patient and she is amenable to this plan.  Toe was prepped in aseptic manner and digital block performed using 3 mL of 2% lidocaine plain.  The nail was avulsed in its entirety and dressings applied.  Post care instructions provided -Prescription for  Keflex 500 mg TID x 10 days -Return to clinic in 3 weeks   Cynthia Pearson, DPM [facility]  Dr. Thresa EMERSON Pearson, DPM    [address] [place], [place] [address]                Office [phone]  Fax [phone]

## 2023-12-22 NOTE — Patient Instructions (Signed)

## 2023-12-28 ENCOUNTER — Other Ambulatory Visit: Payer: Self-pay | Admitting: Internal Medicine

## 2023-12-28 DIAGNOSIS — Z0000 Encounter for general adult medical examination without abnormal findings: Secondary | ICD-10-CM | POA: Diagnosis not present

## 2023-12-28 DIAGNOSIS — E890 Postprocedural hypothyroidism: Secondary | ICD-10-CM | POA: Diagnosis not present

## 2023-12-28 DIAGNOSIS — Z1211 Encounter for screening for malignant neoplasm of colon: Secondary | ICD-10-CM | POA: Diagnosis not present

## 2023-12-28 DIAGNOSIS — I10 Essential (primary) hypertension: Secondary | ICD-10-CM | POA: Diagnosis not present

## 2023-12-28 DIAGNOSIS — Z79899 Other long term (current) drug therapy: Secondary | ICD-10-CM | POA: Diagnosis not present

## 2023-12-28 DIAGNOSIS — Z1231 Encounter for screening mammogram for malignant neoplasm of breast: Secondary | ICD-10-CM | POA: Diagnosis not present

## 2023-12-28 DIAGNOSIS — K5909 Other constipation: Secondary | ICD-10-CM | POA: Diagnosis not present

## 2023-12-28 DIAGNOSIS — E7800 Pure hypercholesterolemia, unspecified: Secondary | ICD-10-CM | POA: Diagnosis not present

## 2023-12-28 DIAGNOSIS — Z1389 Encounter for screening for other disorder: Secondary | ICD-10-CM | POA: Diagnosis not present

## 2023-12-28 DIAGNOSIS — H547 Unspecified visual loss: Secondary | ICD-10-CM | POA: Diagnosis not present

## 2023-12-31 NOTE — Progress Notes (Unsigned)
 Cardiology Office Note:  .   Date:  01/01/2024  ID:  Cynthia Pearson, DOB 03-18-1950, MRN [id number] PCP: Dwight Trula SQUIBB, MD  [facility] Providers Cardiologist:  None { History of Present Illness: .    Chief Complaint  Patient presents with   Abnormal ECG    Cynthia Pearson is a 73 y.o. female with history of HTN who presents for the evaluation of abnormal EKG at the request of Trula Dwight, MD.   History of Present Illness   Cynthia Pearson is a 73 year old female with hypertension who presents for evaluation of abnormal EKG and hypertension. She was referred by her primary doctor for evaluation of her blood pressure.  She has a history of hypertension and has experienced adverse reactions to multiple antihypertensive medications, including telmisartan, atenolol, metoprolol, lisinopril, losartan, and amlodipine, which caused severe skin rashes and gastrointestinal intolerance. She is currently taking hydralazine 10 mg three times a day, which she started recently after initially taking it once a day due to fear of side effects. Her blood pressure has improved since increasing the dose, although she has not been consistently monitoring it at home.  She experienced a severe reaction to a previous blood pressure medication, which included a rash on her back, difficulty breathing, and inability to eat for three days. This reaction led to significant weakness and difficulty moving around her home.  She has no history of diabetes, heart attack, or stroke. She is not currently on any cholesterol medication but has been in the past. She tries to manage her cholesterol through diet, avoiding fried foods and red meat, and focusing on fruits, vegetables, and lean proteins.  She has a history of glaucoma and cataract surgery, and she wears compression socks. No current chest pain or trouble breathing, except during the adverse reaction to telmisartan.  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 [place] opportunities for physical activity due to the environment. She does not smoke or drink alcohol.          Problem List HTN Hypothyroidism     ROS: All other ROS reviewed and negative. Pertinent positives noted in the HPI.     Studies Reviewed: SABRA   EKG Interpretation Date/Time:  Friday January 01 2024 09:42:22 EST Ventricular Rate:  80 PR Interval:  160 QRS Duration:  90 QT Interval:  392 QTC Calculation: 452 R Axis:   -29  Text Interpretation: Normal sinus rhythm Moderate voltage criteria for LVH, may be normal variant ( R in aVL , Cornell product ) Anterior infarct (cited on or before 25-Jul-2004) Confirmed by Barbaraann Kotyk [phone]) on 01/01/2024 9:47:08 AM   Physical Exam:   VS:  BP (!) 140/94 (BP Location: Left Arm, Patient Position: Sitting)   Pulse 80   Ht 5' 3 (1.6 m)   Wt 151 lb 12.8 oz (68.9 kg)   SpO2 (!) 87%   BMI 26.89 kg/m    Wt Readings from Last 3 Encounters:  01/01/24 151 lb 12.8 oz (68.9 kg)  12/22/23 150 lb 3.2 oz (68.1 kg)  10/01/23 150 lb 3.2 oz (68.1 kg)    GEN: Well nourished, well developed in no acute distress NECK: No JVD; No carotid bruits CARDIAC: RRR, no murmurs, rubs, gallops RESPIRATORY:  Clear to auscultation without rales, wheezing or rhonchi  ABDOMEN: Soft, non-tender, non-distended EXTREMITIES:  No edema; No deformity  ASSESSMENT AND PLAN: .   Assessment and Plan    Essential hypertension with intolerance to multiple antihypertensive agents Hypertension with intolerance to multiple antihypertensive agents, including metoprolol, telmisartan, lisinopril, losartan, and amlodipine, due to rashes or gastrointestinal intolerance. Currently on hydralazine 10 mg three times a day, which has shown improvement in blood pressure. She is sensitive to medications and prefers to maintain the current dose due to concerns about potential  skin flare-ups. Blood pressure is 140/94 mmHg, improved from previous readings. - Continue hydralazine 10 mg three times a day. - Check blood pressure twice a day and record readings. - Reduce salt intake. - Ordered echocardiogram to assess cardiac function. - Scheduled follow-up in six weeks for blood pressure check.  Abnormal electrocardiogram with left ventricular hypertrophy EKG shows left ventricular hypertrophy (LVH). Echocardiogram is needed to evaluate cardiac structure and function. - Ordered echocardiogram to evaluate left ventricular hypertrophy.  Hypercholesterolemia with intolerance to statins Elevated cholesterol levels with intolerance to statins. Currently not on cholesterol medication and managing diet to control cholesterol levels. - Continue dietary management to control cholesterol levels.              Follow-up: Return in about 6 weeks (around 02/12/2024).   Signed, Darryle DASEN. Barbaraann, MD, [facility]  [facility]  [address] [place], [place] [address] [phone]  10:03 AM

## 2024-01-01 ENCOUNTER — Ambulatory Visit: Attending: Cardiovascular Disease | Admitting: Cardiovascular Disease

## 2024-01-01 ENCOUNTER — Encounter: Payer: Self-pay | Admitting: Cardiovascular Disease

## 2024-01-01 VITALS — BP 140/94 | HR 80 | Ht 63.0 in | Wt 151.8 lb

## 2024-01-01 DIAGNOSIS — R9431 Abnormal electrocardiogram [ECG] [EKG]: Secondary | ICD-10-CM | POA: Diagnosis not present

## 2024-01-01 DIAGNOSIS — R931 Abnormal findings on diagnostic imaging of heart and coronary circulation: Secondary | ICD-10-CM | POA: Diagnosis not present

## 2024-01-01 DIAGNOSIS — I10 Essential (primary) hypertension: Secondary | ICD-10-CM | POA: Diagnosis not present

## 2024-01-01 NOTE — Patient Instructions (Addendum)
 Medication Instructions:  Your physician recommends that you continue on your current medications as directed. Please refer to the Current Medication list given to you today.  *If you need a refill on your cardiac medications before your next appointment, please call your pharmacy*  Lab Work: None ordered.  You may go to any [facility] Location for your lab work:  [facility] - [address] ([facility]) - [address] [phone] [address]  [place] - [address]   [place]  - [address]   [place] - [address] - [address] [employer]  - [address] [place] - [address] ([facility]   If you have labs (blood work) drawn today and your tests are completely normal, you will receive your results only by: [employer] (if you have MyChart)  If you have any lab test that is abnormal or we need to change your treatment, we will call you or send a MyChart message to review the results.  Testing/Procedures: Your physician has requested that you have an echocardiogram. Echocardiography is a painless test that uses sound waves to create images of your heart. It provides your doctor with information about the size and shape of your heart and how well your heart's chambers and valves are working. This procedure takes approximately one hour. There are no restrictions for this procedure. Please do NOT wear cologne, perfume, aftershave, or lotions (deodorant is allowed). Please arrive 15 minutes prior to your appointment time.  Please note: We ask at that you not bring children with you during ultrasound (echo/ vascular) testing. Due to room size and safety concerns, children are not allowed in the ultrasound rooms during exams. Our front office staff cannot provide observation of children in our lobby area while testing is being conducted. An adult accompanying a patient  to their appointment will only be allowed in the ultrasound room at the discretion of the ultrasound technician under special circumstances. We apologize for any inconvenience.   Follow-Up: At [facility], you and your health needs are our priority.  As part of our continuing mission to provide you with exceptional heart care, we have created designated Provider Care Teams.  These Care Teams include your primary Cardiologist (physician) and Advanced Practice Providers (APPs -  Physician Assistants and Nurse Practitioners) who all work together to provide you with the care you need, when you need it.  We recommend signing up for the patient portal called MyChart.  Sign up information is provided on this After Visit Summary.  MyChart is used to connect with patients for Virtual Visits (Telemedicine).  Patients are able to view lab/test results, encounter notes, upcoming appointments, etc.  Non-urgent messages can be sent to your provider as well.   To learn more about what you can do with MyChart, go to [email or web address].    Your next appointment:   6 weeks with Advanced Practice Provider 1 year with Dr Barbaraann

## 2024-01-12 ENCOUNTER — Encounter: Payer: Self-pay | Admitting: Podiatry

## 2024-01-12 ENCOUNTER — Ambulatory Visit: Admitting: Podiatry

## 2024-01-12 VITALS — Ht 63.0 in | Wt 151.8 lb

## 2024-01-12 DIAGNOSIS — L03032 Cellulitis of left toe: Secondary | ICD-10-CM | POA: Diagnosis not present

## 2024-01-12 NOTE — Progress Notes (Signed)
   Chief Complaint  Patient presents with   Nail Problem    Pt is here to f/u on left great toenail, she states everything is going well still has some soreness.    Subjective: 73 y.o. female presents today status post total temporary nail avulsion procedure of the left great toe that was performed on 12/12/2023.  Patient doing well.  She feels significantly better.  She has been soaking her foot and applying antibiotic ointment as instructed.   Past Medical History:  Diagnosis Date   Fibroids    Glaucoma    Hyperlipidemia    Hypertension    Thyroid  cancer (HCC)     Objective: Neurovascular status intact.  Skin is warm, dry and supple. Nail bed and respective nail fold appears to be healing appropriately.   Assessment: #1 s/p total temporary nail avulsion left great toenail   Plan of care: -Patient was evaluated  -Light debridement of the periungual debris was performed to the border of the respective toe and nail plate using a tissue nipper. -Patient is to return to clinic on a PRN basis.   Thresa EMERSON Sar, DPM [facility]  Dr. Thresa EMERSON Sar, DPM    [address] [place], [place] [address]                Office [phone]  Fax [phone]

## 2024-02-09 ENCOUNTER — Ambulatory Visit: Payer: Self-pay | Admitting: Cardiovascular Disease

## 2024-02-09 ENCOUNTER — Ambulatory Visit (HOSPITAL_COMMUNITY)
Admission: RE | Admit: 2024-02-09 | Discharge: 2024-02-09 | Disposition: A | Source: Ambulatory Visit | Attending: Cardiovascular Disease | Admitting: Cardiovascular Disease

## 2024-02-09 DIAGNOSIS — R931 Abnormal findings on diagnostic imaging of heart and coronary circulation: Secondary | ICD-10-CM

## 2024-02-09 DIAGNOSIS — I340 Nonrheumatic mitral (valve) insufficiency: Secondary | ICD-10-CM | POA: Insufficient documentation

## 2024-02-09 DIAGNOSIS — I10 Essential (primary) hypertension: Secondary | ICD-10-CM | POA: Diagnosis not present

## 2024-02-09 DIAGNOSIS — Z859 Personal history of malignant neoplasm, unspecified: Secondary | ICD-10-CM | POA: Diagnosis not present

## 2024-02-09 DIAGNOSIS — E785 Hyperlipidemia, unspecified: Secondary | ICD-10-CM | POA: Diagnosis not present

## 2024-02-09 LAB — ECHOCARDIOGRAM COMPLETE
Area-P 1/2: 4.52 cm2
S' Lateral: 3.69 cm

## 2024-02-15 ENCOUNTER — Ambulatory Visit: Attending: Emergency Medicine | Admitting: Emergency Medicine

## 2024-02-15 NOTE — Progress Notes (Deleted)
" °  Cardiology Office Note:    Date:  02/15/2024  ID:  Cynthia Pearson, DOB 1950/09/05, MRN [id number] PCP: Dwight Trula SQUIBB, MD  [facility] Providers Cardiologist:  None { Click to update primary MD,subspecialty MD or APP then REFRESH:1}    {Click to Open Review  :1}   Patient Profile:       Chief Complaint: *** History of Present Illness:  Cynthia Pearson is a 73 y.o. female with visit-pertinent history of hypertension  Patient established with cardiology service on 01/01/2024 with Dr. Barbaraann for evaluation of abnormal EKG and hypertension.  She has history of hypertension and has experienced adverse reactions to multiple hypertensives including telmisartan, atenolol, metoprolol, lisinopril, losartan, and amlodipine caused severe skin rashes and gastrointestinal intolerance.  She was currently taking hydralazine 10 mg 3 times daily.  Blood pressure was 140/94.  The hydralazine did show improvement in her blood pressure.  No medication changes were made.  EKG did show left ventricular hypertrophy.  She underwent echocardiogram on 02/09/2024 showing LVEF 50 to 55%, normal diastolic parameters, RV function and size normal, left atrial size severely dilated, mild mitral valve regurgitation.  Discussed the use of AI scribe software for clinical note transcription with the patient, who gave verbal consent to proceed.  History of Present Illness     Review of systems:  Please see the history of present illness. All other systems are reviewed and otherwise negative. ***      Studies Reviewed:        ***  Risk Assessment/Calculations:   {Does this patient have ATRIAL FIBRILLATION?:[id number]} No BP recorded.  {Refresh Note OR Click here to enter BP  :1}***        Physical Exam:   VS:  There were no vitals taken for this visit.   Wt Readings from Last 3 Encounters:  01/12/24 151 lb 12.8 oz (68.9 kg)  01/01/24 151 lb 12.8 oz (68.9 kg)  12/22/23 150 lb 3.2 oz (68.1 kg)     GEN: Well nourished, well developed in no acute distress NECK: No JVD; No carotid bruits CARDIAC: ***RRR, no murmurs, rubs, gallops RESPIRATORY:  Clear to auscultation without rales, wheezing or rhonchi  ABDOMEN: Soft, non-tender, non-distended EXTREMITIES:  No edema; No acute deformity ***      Assessment and Plan:    Assessment and Plan Assessment & Plan      {Are you ordering a CV Procedure (e.g. stress test, cath, DCCV, TEE, etc)?   Press F2        :[id number]}  Dispo:  No follow-ups on file.  Signed, Lum LITTIE Louis, NP  "
